# Patient Record
Sex: Female | Born: 1980 | Race: White | Hispanic: No | Marital: Single
Health system: Midwestern US, Community
[De-identification: ages and names within clinical notes are randomized; demographics above are authoritative.]

## PROBLEM LIST (undated history)

## (undated) DIAGNOSIS — O24419 Gestational diabetes mellitus in pregnancy, unspecified control: Secondary | ICD-10-CM

## (undated) DIAGNOSIS — F1120 Opioid dependence, uncomplicated: Secondary | ICD-10-CM

## (undated) DIAGNOSIS — F319 Bipolar disorder, unspecified: Secondary | ICD-10-CM

## (undated) DIAGNOSIS — F329 Major depressive disorder, single episode, unspecified: Secondary | ICD-10-CM

## (undated) DIAGNOSIS — F988 Other specified behavioral and emotional disorders with onset usually occurring in childhood and adolescence: Secondary | ICD-10-CM

## (undated) DIAGNOSIS — F32A Depression, unspecified: Secondary | ICD-10-CM

---

## 2001-08-09 ENCOUNTER — Emergency Department (HOSPITAL_COMMUNITY): Admission: EM | Admit: 2001-08-09 | Discharge: 2001-08-09 | Payer: Self-pay | Admitting: Emergency Medicine

## 2001-10-06 ENCOUNTER — Other Ambulatory Visit: Admission: RE | Admit: 2001-10-06 | Discharge: 2001-10-06 | Payer: Self-pay | Admitting: Obstetrics & Gynecology

## 2002-03-15 ENCOUNTER — Encounter: Admission: RE | Admit: 2002-03-15 | Discharge: 2002-03-15 | Payer: Self-pay | Admitting: Obstetrics and Gynecology

## 2002-05-25 ENCOUNTER — Inpatient Hospital Stay (HOSPITAL_COMMUNITY): Admission: AD | Admit: 2002-05-25 | Discharge: 2002-05-27 | Payer: Self-pay | Admitting: Obstetrics and Gynecology

## 2008-05-03 ENCOUNTER — Ambulatory Visit (HOSPITAL_COMMUNITY): Payer: Self-pay | Admitting: Psychiatry

## 2008-06-07 ENCOUNTER — Ambulatory Visit (HOSPITAL_COMMUNITY): Payer: Self-pay | Admitting: Psychiatry

## 2008-07-20 ENCOUNTER — Ambulatory Visit (HOSPITAL_COMMUNITY): Payer: Self-pay | Admitting: Psychiatry

## 2008-08-04 HISTORY — PX: TUBAL LIGATION: SHX77

## 2008-09-05 ENCOUNTER — Ambulatory Visit (HOSPITAL_COMMUNITY): Payer: Self-pay | Admitting: Psychiatry

## 2008-11-07 ENCOUNTER — Ambulatory Visit (HOSPITAL_COMMUNITY): Payer: Self-pay | Admitting: Psychiatry

## 2009-01-05 ENCOUNTER — Ambulatory Visit (HOSPITAL_COMMUNITY): Payer: Self-pay | Admitting: Psychiatry

## 2009-02-15 ENCOUNTER — Ambulatory Visit (HOSPITAL_COMMUNITY): Payer: Self-pay | Admitting: Psychiatry

## 2009-02-22 ENCOUNTER — Ambulatory Visit: Payer: Self-pay | Admitting: Family Medicine

## 2009-02-22 ENCOUNTER — Ambulatory Visit (HOSPITAL_COMMUNITY): Payer: Self-pay | Admitting: Psychiatry

## 2009-02-22 LAB — CONVERTED CEMR LAB: Clue Cells Wet Prep HPF POC: NONE SEEN

## 2009-02-23 ENCOUNTER — Encounter: Payer: Self-pay | Admitting: Family Medicine

## 2009-02-23 LAB — CONVERTED CEMR LAB
Chlamydia, DNA Probe: NEGATIVE
GC Probe Amp, Genital: NEGATIVE

## 2009-03-02 ENCOUNTER — Ambulatory Visit (HOSPITAL_COMMUNITY): Payer: Self-pay | Admitting: Psychiatry

## 2009-03-06 ENCOUNTER — Ambulatory Visit (HOSPITAL_COMMUNITY): Payer: Self-pay | Admitting: Psychiatry

## 2009-03-07 ENCOUNTER — Ambulatory Visit (HOSPITAL_COMMUNITY): Payer: Self-pay | Admitting: Psychiatry

## 2009-03-22 ENCOUNTER — Ambulatory Visit (HOSPITAL_COMMUNITY): Payer: Self-pay | Admitting: Psychiatry

## 2009-03-29 ENCOUNTER — Ambulatory Visit (HOSPITAL_COMMUNITY): Payer: Self-pay | Admitting: Psychiatry

## 2009-04-03 ENCOUNTER — Ambulatory Visit (HOSPITAL_COMMUNITY): Payer: Self-pay | Admitting: Psychiatry

## 2009-04-10 ENCOUNTER — Ambulatory Visit (HOSPITAL_COMMUNITY): Payer: Self-pay | Admitting: Psychiatry

## 2009-04-12 ENCOUNTER — Ambulatory Visit (HOSPITAL_COMMUNITY): Payer: Self-pay | Admitting: Psychiatry

## 2009-04-19 ENCOUNTER — Ambulatory Visit (HOSPITAL_COMMUNITY): Payer: Self-pay | Admitting: Psychiatry

## 2009-04-30 ENCOUNTER — Ambulatory Visit (HOSPITAL_COMMUNITY): Payer: Self-pay | Admitting: Psychiatry

## 2009-05-10 ENCOUNTER — Ambulatory Visit (HOSPITAL_COMMUNITY): Payer: Self-pay | Admitting: Psychiatry

## 2009-05-17 ENCOUNTER — Ambulatory Visit (HOSPITAL_COMMUNITY): Payer: Self-pay | Admitting: Psychiatry

## 2009-05-24 ENCOUNTER — Ambulatory Visit (HOSPITAL_COMMUNITY): Payer: Self-pay | Admitting: Psychiatry

## 2009-05-31 ENCOUNTER — Ambulatory Visit (HOSPITAL_COMMUNITY): Payer: Self-pay | Admitting: Psychiatry

## 2009-06-06 ENCOUNTER — Ambulatory Visit (HOSPITAL_COMMUNITY): Payer: Self-pay | Admitting: Psychiatry

## 2009-06-14 ENCOUNTER — Ambulatory Visit (HOSPITAL_COMMUNITY): Payer: Self-pay | Admitting: Psychiatry

## 2009-06-21 ENCOUNTER — Ambulatory Visit (HOSPITAL_COMMUNITY): Payer: Self-pay | Admitting: Psychiatry

## 2009-07-05 ENCOUNTER — Ambulatory Visit (HOSPITAL_COMMUNITY): Payer: Self-pay | Admitting: Psychiatry

## 2009-08-04 HISTORY — PX: CHOLECYSTECTOMY: SHX55

## 2009-08-09 ENCOUNTER — Ambulatory Visit (HOSPITAL_COMMUNITY): Payer: Self-pay | Admitting: Psychiatry

## 2009-08-16 ENCOUNTER — Ambulatory Visit (HOSPITAL_COMMUNITY): Payer: Self-pay | Admitting: Psychiatry

## 2009-08-30 ENCOUNTER — Ambulatory Visit (HOSPITAL_COMMUNITY): Payer: Self-pay | Admitting: Psychiatry

## 2009-09-02 ENCOUNTER — Ambulatory Visit: Payer: Self-pay | Admitting: Family Medicine

## 2009-09-04 ENCOUNTER — Ambulatory Visit (HOSPITAL_COMMUNITY): Payer: Self-pay | Admitting: Psychiatry

## 2009-09-06 ENCOUNTER — Ambulatory Visit (HOSPITAL_COMMUNITY): Payer: Self-pay | Admitting: Psychiatry

## 2009-10-11 ENCOUNTER — Ambulatory Visit (HOSPITAL_COMMUNITY): Payer: Self-pay | Admitting: Licensed Clinical Social Worker

## 2009-10-18 ENCOUNTER — Ambulatory Visit (HOSPITAL_COMMUNITY): Payer: Self-pay | Admitting: Licensed Clinical Social Worker

## 2009-10-25 ENCOUNTER — Ambulatory Visit (HOSPITAL_COMMUNITY): Payer: Self-pay | Admitting: Licensed Clinical Social Worker

## 2009-11-06 ENCOUNTER — Ambulatory Visit (HOSPITAL_COMMUNITY): Payer: Self-pay | Admitting: Psychiatry

## 2009-11-08 ENCOUNTER — Ambulatory Visit (HOSPITAL_COMMUNITY): Payer: Self-pay | Admitting: Licensed Clinical Social Worker

## 2009-12-13 ENCOUNTER — Ambulatory Visit (HOSPITAL_COMMUNITY): Payer: Self-pay | Admitting: Licensed Clinical Social Worker

## 2010-01-02 ENCOUNTER — Ambulatory Visit (HOSPITAL_COMMUNITY): Payer: Self-pay | Admitting: Psychiatry

## 2010-01-04 ENCOUNTER — Ambulatory Visit: Payer: Self-pay | Admitting: Family Medicine

## 2010-01-04 DIAGNOSIS — N760 Acute vaginitis: Secondary | ICD-10-CM | POA: Insufficient documentation

## 2010-01-04 DIAGNOSIS — N898 Other specified noninflammatory disorders of vagina: Secondary | ICD-10-CM | POA: Insufficient documentation

## 2010-01-05 ENCOUNTER — Encounter: Payer: Self-pay | Admitting: Family Medicine

## 2010-01-05 LAB — CONVERTED CEMR LAB
Chlamydia, DNA Probe: NEGATIVE
GC Probe Amp, Genital: NEGATIVE

## 2010-02-25 ENCOUNTER — Ambulatory Visit (HOSPITAL_COMMUNITY): Payer: Self-pay | Admitting: Psychiatry

## 2010-03-12 ENCOUNTER — Ambulatory Visit (HOSPITAL_COMMUNITY): Payer: Self-pay | Admitting: Licensed Clinical Social Worker

## 2010-03-20 ENCOUNTER — Ambulatory Visit (HOSPITAL_COMMUNITY): Payer: Self-pay | Admitting: Licensed Clinical Social Worker

## 2010-04-10 ENCOUNTER — Ambulatory Visit (HOSPITAL_COMMUNITY): Payer: Self-pay | Admitting: Licensed Clinical Social Worker

## 2010-04-25 ENCOUNTER — Ambulatory Visit (HOSPITAL_COMMUNITY): Payer: Self-pay | Admitting: Licensed Clinical Social Worker

## 2010-05-27 ENCOUNTER — Ambulatory Visit (HOSPITAL_COMMUNITY): Payer: Self-pay | Admitting: Psychiatry

## 2010-06-05 ENCOUNTER — Ambulatory Visit (HOSPITAL_COMMUNITY): Payer: Self-pay | Admitting: Licensed Clinical Social Worker

## 2010-06-10 ENCOUNTER — Ambulatory Visit (HOSPITAL_COMMUNITY): Payer: Self-pay | Admitting: Licensed Clinical Social Worker

## 2010-07-01 ENCOUNTER — Ambulatory Visit (HOSPITAL_COMMUNITY): Payer: Self-pay | Admitting: Psychiatry

## 2010-07-02 ENCOUNTER — Ambulatory Visit (HOSPITAL_COMMUNITY): Payer: Self-pay | Admitting: Licensed Clinical Social Worker

## 2010-09-03 NOTE — Assessment & Plan Note (Signed)
Summary: LBP- x 1 dy rm 2   Vital Signs:  Patient Profile:   30 Years Old Female CC:      LBP x 1 day Height:     67 inches Weight:      182 pounds O2 Sat:      100 % O2 treatment:    Room Air Temp:     97.3 degrees F oral Pulse rate:   83 / minute Pulse rhythm:   regular Resp:     16 per minute BP sitting:   120 / 75  (right arm) Cuff size:   regular  Pt. in pain?   yes    Location:   lower back    Intensity:   8    Type:       sharp  Vitals Entered By: Areta Haber CMA (September 02, 2009 2:03 PM)                   Current Allergies: No known allergies History of Present Illness Chief Complaint: LBP x 1 day History of Present Illness: Loading dishwasher adn htne sudden sharp pain in her left low back.  Hard to stand up. Took IBU last night and not really helpful. Laid on heating pad.  NO prior injuries to her back.  Never happened before. Turning and standing up more painful. Moving in general hurts worse.  No alleviating sxs.  No raditaion of pain into her legs or weekness. No numbness or tingling. Still a sharp pain about 8/10.  No urinary sxs or fever.   Current Problems: BACK PAIN (ICD-724.5)   Current Meds SEROQUEL 100 MG TABS (QUETIAPINE FUMARATE) 1 qd LEXAPRO 20 MG TABS (ESCITALOPRAM OXALATE) 1 tab by mouth once daily METRONIDAZOLE 500 MG  TABS (METRONIDAZOLE) 1 tablet by mouth 3x a day do not take w/ etoh ADVIL 200 MG TABS (IBUPROFEN) as directed TRAMADOL HCL 50 MG TABS (TRAMADOL HCL) Take 1 tablet by mouth three times a day  as needed for severe pain. DICLOFENAC SODIUM 75 MG TBEC (DICLOFENAC SODIUM) Take 1 tablet by mouth two times a day for inflammation of back FLEXERIL 10 MG TABS (CYCLOBENZAPRINE HCL) 1/2-1 tab by mouth up to three times a day as needed muscle spasm  REVIEW OF SYSTEMS Constitutional Symptoms      Denies fever, chills, night sweats, weight loss, weight gain, and fatigue.  Eyes       Denies change in vision, eye pain, eye discharge,  glasses, contact lenses, and eye surgery. Ear/Nose/Throat/Mouth       Denies hearing loss/aids, change in hearing, ear pain, ear discharge, dizziness, frequent runny nose, frequent nose bleeds, sinus problems, sore throat, hoarseness, and tooth pain or bleeding.  Respiratory       Denies dry cough, productive cough, wheezing, shortness of breath, asthma, bronchitis, and emphysema/COPD.  Cardiovascular       Denies murmurs, chest pain, and tires easily with exhertion.    Gastrointestinal       Denies stomach pain, nausea/vomiting, diarrhea, constipation, blood in bowel movements, and indigestion. Genitourniary       Denies painful urination, kidney stones, and loss of urinary control. Neurological       Denies paralysis, seizures, and fainting/blackouts. Musculoskeletal       Complains of decreased range of motion.      Denies muscle pain, joint pain, joint stiffness, redness, swelling, muscle weakness, and gout.      Comments: LBP x 1 day Skin  Denies bruising, unusual mles/lumps or sores, and hair/skin or nail changes.  Psych       Denies mood changes, temper/anger issues, anxiety/stress, speech problems, depression, and sleep problems. Other Comments: Pt states that she was loading/unloading  the diswaher when she felt a sharp pain in her lower back.   Past History:  Past Medical History: Last updated: 02/22/2009 Bi-polar Depression  Past Surgical History: Last updated: 02/22/2009 tubal ligation  Family History: Last updated: 02/22/2009 Mother, Healthy Father, Healthy Brother, Healthy Sister, Healthy  Social History: Last updated: 02/22/2009 1 ppd smoker, 15 yrs ETOH-yes No Drugs Unemployed Physical Exam General appearance: well developed, well nourished, no acute distress Head: normocephalic, atraumatic Back: Decreased flexion to 45 degree. Normal side bending and rotation right and left thought more painful to the right.  Normal extension. Nontender over the  lumbar spine . Midly tender over the left paraspinour muscles.  No SI joint tendernesss.   Neg straight leg raise. Hip, knee, and ankle strength 5/5.  Patellar and achilles 2+ bilat.   Assessment New Problems: BACK PAIN (ICD-724.5)   Plan New Medications/Changes: FLEXERIL 10 MG TABS (CYCLOBENZAPRINE HCL) 1/2-1 tab by mouth up to three times a day as needed muscle spasm  #20 x 0, 09/02/2009, Nani Gasser MD DICLOFENAC SODIUM 75 MG TBEC (DICLOFENAC SODIUM) Take 1 tablet by mouth two times a day for inflammation of back  #30 x 0, 09/02/2009, Nani Gasser MD TRAMADOL HCL 50 MG TABS (TRAMADOL HCL) Take 1 tablet by mouth three times a day  as needed for severe pain.  #20 x 0, 09/02/2009, Nani Gasser MD  New Orders: Est. Patient Level IV [phone] Planning Comments:   REviewed some home exercises to start tomorrow. USE NSAID two times a day with food. Stop if stomach bothers her. Can use the muscle relaxer as needed. Warned about sedation. Don't dirve with med. Can use tramadol on top of this as needed for acute relief. Fu in 1 week if not improving or sooner if sxs worsen.   The patient and/or caregiver has been counseled thoroughly with regard to medications prescribed including dosage, schedule, interactions, rationale for use, and possible side effects and they verbalize understanding.  Diagnoses and expected course of recovery discussed and will return if not improved as expected or if the condition worsens. Patient and/or caregiver verbalized understanding.  Prescriptions: FLEXERIL 10 MG TABS (CYCLOBENZAPRINE HCL) 1/2-1 tab by mouth up to three times a day as needed muscle spasm  #20 x 0   Entered and Authorized by:   Nani Gasser MD   Signed by:   Nani Gasser MD on 09/02/2009   Method used:   Electronically to        [facility] [phone]* (retail)       [address] [place], [place]  [address]       Ph: [phone]       Fax: [phone]   RxID:    [id number] DICLOFENAC SODIUM 75 MG TBEC (DICLOFENAC SODIUM) Take 1 tablet by mouth two times a day for inflammation of back  #30 x 0   Entered and Authorized by:   Nani Gasser MD   Signed by:   Nani Gasser MD on 09/02/2009   Method used:   Electronically to        [facility] [phone]* (retail)       [address] [place]  [address]       Ph: [phone]       Fax: [phone]   RxID:   [id number] TRAMADOL HCL 50 MG TABS (TRAMADOL HCL) Take 1 tablet by mouth three times a day  as needed for severe pain.  #20 x 0   Entered and Authorized by:   Nani Gasser MD   Signed by:   Nani Gasser MD on 09/02/2009   Method used:   Electronically to        [facility] [phone]* (retail)       [address] [place], [place]  [address]       Ph: [phone]       Fax: [phone]   RxID:   [id number]   Patient Instructions: 1)  Discussed gentle streches. Avoid lying in bed.

## 2010-09-03 NOTE — Assessment & Plan Note (Signed)
Summary: BACTERIAL INFECTION   Vital Signs:  Patient Profile:   30 Years Old Female CC:      vaginal discharge and foul odor X 2 weeks LMP:     12/15/2009 Height:     67 inches Weight:      180 pounds O2 Sat:      96 % O2 treatment:    Room Air Temp:     97.7 degrees F oral Pulse rate:   79 / minute Resp:     16 per minute BP sitting:   120 / 76  (right arm) Cuff size:   regular  Pt. in pain?   no  Vitals Entered By: Lajean Saver RN (January 04, 2010 2:51 PM)  Menstrual History: LMP (date): 12/15/2009                   Updated Prior Medication List: SEROQUEL 100 MG TABS (QUETIAPINE FUMARATE) 1 qd LEXAPRO 20 MG TABS (ESCITALOPRAM OXALATE) 1 tab by mouth once daily METRONIDAZOLE 500 MG  TABS (METRONIDAZOLE) 1 tablet by mouth 3x a day do not take w/ etoh TRAMADOL HCL 50 MG TABS (TRAMADOL HCL) Take 1 tablet by mouth three times a day  as needed for severe pain.  Current Allergies (reviewed today): No known allergies History of Present Illness Chief Complaint: vaginal discharge and foul odor X 2 weeks History of Present Illness: Subjective:  Patient complains of 2 week history of foul smelling vaginal discharge without pelvic pain or external vulvar symptoms.  Last menstrual period 3 weeks ago and normal.  She is s/p BTL.  No fevers, chills, and sweats.  No nausea/vomiting.  No urinary symptoms. She states that she has had bacterial vaginosis before and present symptoms are similar.   Last intercourse one week ago.  She states that she has not had a pap smear.  REVIEW OF SYSTEMS Constitutional Symptoms      Denies fever, chills, night sweats, weight loss, weight gain, and fatigue.  Eyes       Denies change in vision, eye pain, eye discharge, glasses, contact lenses, and eye surgery. Ear/Nose/Throat/Mouth       Denies hearing loss/aids, change in hearing, ear pain, ear discharge, dizziness, frequent runny nose, frequent nose bleeds, sinus problems, sore throat, hoarseness,  and tooth pain or bleeding.  Respiratory       Denies dry cough, productive cough, wheezing, shortness of breath, asthma, bronchitis, and emphysema/COPD.  Cardiovascular       Denies murmurs, chest pain, and tires easily with exhertion.    Gastrointestinal       Denies stomach pain, nausea/vomiting, diarrhea, constipation, blood in bowel movements, and indigestion. Genitourniary       Complains of blood or discharge from vagina.      Denies painful urination, kidney stones, and loss of urinary control.      Comments: odor Neurological       Denies paralysis, seizures, and fainting/blackouts. Musculoskeletal       Denies muscle pain, joint pain, joint stiffness, decreased range of motion, redness, swelling, muscle weakness, and gout.  Skin       Denies bruising, unusual mles/lumps or sores, and hair/skin or nail changes.  Psych       Denies mood changes, temper/anger issues, anxiety/stress, speech problems, depression, and sleep problems. Other Comments: X 2 weeks   Past History:  Past Medical History: Reviewed history from 02/22/2009 and no changes required. Bi-polar Depression  Past Surgical History: Reviewed history from 02/22/2009  and no changes required. tubal ligation  Family History: Reviewed history from 02/22/2009 and no changes required. Mother, Healthy Father, Healthy Brother, Healthy Sister, Healthy  Social History: 1 ppd smoker, 15 yrs ETOH-yes No Drugs   Objective:  Appearance:  Patient appears healthy, stated age, and in no acute distress  Neck:  Supple.  No adenopathy is present.  No thyromegaly is present  Lungs:  Clear to auscultation.  Breath sounds are equal.  Heart:  Regular rate and rhythm without murmurs, rubs, or gallops.  Abdomen:  Nontender without masses or hepatosplenomegaly.  Bowel sounds are present.  No CVA or flank tenderness.  Genitourinary:  Vulva appears normal without lesions or erythema.  Vagina has normal  mucosae without  lesions.   A small amount of clear mucoid discharge is present in the vaginal vault.  Cervix appears normal without lesions.  There is a small amount of blood tinged mucous present in the cervical os.  No cervical motion tenderness present.    Uterus is small and nontender.  Adnexae are nontender without massess.  KOH prep negative Wet prep:  clue cells present. Assessment New Problems: BACTERIAL VAGINITIS (ICD-616.10) VAGINAL DISCHARGE (ICD-623.5)  BACTERIAL VAGINOSIS  Plan New Medications/Changes: METROGEL-VAGINAL 0.75 % GEL (METRONIDAZOLE) 1 applicatorful PV once daily for 5 days  #1 tube x 0, 01/04/2010, Donna Christen MD  New Orders: T- GC Chlamydia [16109] Wet Prep [[id number]] Est. Patient Level IV [81191] Planning Comments:   Chlamydia/GC pending. Begin Metrogel vaginal  Follow-up with PCP if not improving or if symptoms worsen.   The patient and/or caregiver has been counseled thoroughly with regard to medications prescribed including dosage, schedule, interactions, rationale for use, and possible side effects and they verbalize understanding.  Diagnoses and expected course of recovery discussed and will return if not improved as expected or if the condition worsens. Patient and/or caregiver verbalized understanding.  Prescriptions: METROGEL-VAGINAL 0.75 % GEL (METRONIDAZOLE) 1 applicatorful PV once daily for 5 days  #1 tube x 0   Entered and Authorized by:   Donna Christen MD   Signed by:   Donna Christen MD on 01/04/2010   Method used:   Print then Give to Patient   RxID:   [id number]   Orders Added: 1)  T- GC Chlamydia [46962] 2)  Wet Prep [[id number]] 3)  Est. Patient Level IV [24401]

## 2011-01-02 ENCOUNTER — Encounter (HOSPITAL_COMMUNITY): Payer: Self-pay | Admitting: Licensed Clinical Social Worker

## 2011-01-09 ENCOUNTER — Encounter (INDEPENDENT_AMBULATORY_CARE_PROVIDER_SITE_OTHER): Payer: Medicaid Other | Admitting: Psychiatry

## 2011-01-09 DIAGNOSIS — F319 Bipolar disorder, unspecified: Secondary | ICD-10-CM

## 2011-01-21 ENCOUNTER — Encounter (HOSPITAL_COMMUNITY): Payer: Medicaid Other | Admitting: Licensed Clinical Social Worker

## 2011-02-20 ENCOUNTER — Encounter (INDEPENDENT_AMBULATORY_CARE_PROVIDER_SITE_OTHER): Payer: Medicaid Other | Admitting: Licensed Clinical Social Worker

## 2011-02-20 DIAGNOSIS — F3162 Bipolar disorder, current episode mixed, moderate: Secondary | ICD-10-CM

## 2011-03-13 ENCOUNTER — Encounter (HOSPITAL_COMMUNITY): Payer: Medicaid Other | Admitting: Psychiatry

## 2011-12-10 ENCOUNTER — Emergency Department (HOSPITAL_COMMUNITY): Payer: Self-pay

## 2011-12-10 ENCOUNTER — Encounter (HOSPITAL_COMMUNITY): Payer: Self-pay | Admitting: Emergency Medicine

## 2011-12-10 ENCOUNTER — Emergency Department (HOSPITAL_COMMUNITY)
Admission: EM | Admit: 2011-12-10 | Discharge: 2011-12-10 | Disposition: A | Discharge status: Home / Self Care | Payer: Self-pay | Attending: Emergency Medicine | Admitting: Emergency Medicine

## 2011-12-10 DIAGNOSIS — N12 Tubulo-interstitial nephritis, not specified as acute or chronic: Secondary | ICD-10-CM | POA: Insufficient documentation

## 2011-12-10 DIAGNOSIS — R109 Unspecified abdominal pain: Secondary | ICD-10-CM | POA: Insufficient documentation

## 2011-12-10 HISTORY — DX: Gestational diabetes mellitus in pregnancy, unspecified control: O24.419

## 2011-12-10 LAB — CBC
HCT: 45.5 % (ref 36.0–46.0)
MCHC: 34.7 g/dL (ref 30.0–36.0)
RDW: 12.6 % (ref 11.5–15.5)

## 2011-12-10 LAB — DIFFERENTIAL
Basophils Absolute: 0 10*3/uL (ref 0.0–0.1)
Basophils Relative: 1 % (ref 0–1)
Eosinophils Absolute: 0.2 10*3/uL (ref 0.0–0.7)
Eosinophils Relative: 3 % (ref 0–5)
Monocytes Absolute: 0.7 10*3/uL (ref 0.1–1.0)
Neutro Abs: 5 10*3/uL (ref 1.7–7.7)

## 2011-12-10 LAB — COMPREHENSIVE METABOLIC PANEL
AST: 34 U/L (ref 0–37)
Albumin: 4 g/dL (ref 3.5–5.2)
Calcium: 9.5 mg/dL (ref 8.4–10.5)
Chloride: 103 mEq/L (ref 96–112)
Creatinine, Ser: 0.72 mg/dL (ref 0.50–1.10)
Total Protein: 7.4 g/dL (ref 6.0–8.3)

## 2011-12-10 LAB — URINE MICROSCOPIC-ADD ON

## 2011-12-10 LAB — URINALYSIS, ROUTINE W REFLEX MICROSCOPIC
Bilirubin Urine: NEGATIVE
Ketones, ur: NEGATIVE mg/dL
Nitrite: POSITIVE — AB
Specific Gravity, Urine: 1.014 (ref 1.005–1.030)
Urobilinogen, UA: 1 mg/dL (ref 0.0–1.0)

## 2011-12-10 MED ORDER — SODIUM CHLORIDE 0.9 % IV BOLUS (SEPSIS)
1000.0000 mL | Freq: Once | INTRAVENOUS | Status: AC
  Administered 2011-12-10: 1000 mL via INTRAVENOUS

## 2011-12-10 MED ORDER — MORPHINE SULFATE 4 MG/ML IJ SOLN
8.0000 mg | Freq: Once | INTRAMUSCULAR | Status: AC
  Administered 2011-12-10: 8 mg via INTRAVENOUS
  Filled 2011-12-10: qty 2

## 2011-12-10 MED ORDER — CIPROFLOXACIN IN D5W 400 MG/200ML IV SOLN
400.0000 mg | Freq: Once | INTRAVENOUS | Status: AC
  Administered 2011-12-10: 400 mg via INTRAVENOUS
  Filled 2011-12-10: qty 200

## 2011-12-10 MED ORDER — CIPROFLOXACIN HCL 500 MG PO TABS: 500.0000 mg | ORAL_TABLET | Freq: Two times a day (BID) | ORAL | Status: AC

## 2011-12-10 MED ORDER — HYDROMORPHONE HCL PF 1 MG/ML IJ SOLN
1.0000 mg | Freq: Once | INTRAMUSCULAR | Status: AC
  Administered 2011-12-10: 1 mg via INTRAVENOUS
  Filled 2011-12-10: qty 1

## 2011-12-10 MED ORDER — OXYCODONE HCL 5 MG PO TABS: 5.0000 mg | ORAL_TABLET | ORAL | Status: AC | PRN

## 2011-12-10 MED ORDER — ONDANSETRON HCL 4 MG/2ML IJ SOLN
4.0000 mg | Freq: Once | INTRAMUSCULAR | Status: AC
  Administered 2011-12-10: 4 mg via INTRAVENOUS
  Filled 2011-12-10: qty 2

## 2011-12-10 MED ORDER — NICOTINE 14 MG/24HR TD PT24
14.0000 mg | MEDICATED_PATCH | Freq: Once | TRANSDERMAL | Status: DC
  Administered 2011-12-10: 14 mg via TRANSDERMAL
  Filled 2011-12-10: qty 1

## 2011-12-10 NOTE — ED Provider Notes (Signed)
Complains of right flank pain gradual and since yesterday no nausea no vomiting no fever no treatment prior to coming here. Admits to mild dysuria accompanying these symptoms No other associated symptoms. no abdominal pain no other associated symptoms on exam alert nontoxic Lungs clear auscultation abdomen nondistended nontender positive right flank tenderness Lab work exam and history consistent with pyelonephritis  Doug Sou, MD 12/10/11 [phone]

## 2011-12-10 NOTE — Discharge Instructions (Signed)
Pyelonephritis, Adult Pyelonephritis is a kidney infection. In general, there are 2 main types of pyelonephritis:  Infections that come on quickly without any warning (acute pyelonephritis).   Infections that persist for a long period of time (chronic pyelonephritis).  CAUSES  Two main causes of pyelonephritis are:  Bacteria traveling from the bladder to the kidney. This is a problem especially in pregnant women. The urine in the bladder can become filled with bacteria from multiple causes, including:   Inflammation of the prostate gland (prostatitis).   Sexual intercourse in females.   Bladder infection (cystitis).   Bacteria traveling from the bloodstream to the tissue part of the kidney.  Problems that may increase your risk of getting a kidney infection include:  Diabetes.   Kidney stones or bladder stones.   Cancer.   Catheters placed in the bladder.   Other abnormalities of the kidney or ureter.  SYMPTOMS   Abdominal pain.   Pain in the side or flank area.   Fever.   Chills.   Upset stomach.   Blood in the urine (dark urine).   Frequent urination.   Strong or persistent urge to urinate.   Burning or stinging when urinating.  DIAGNOSIS  Your caregiver may diagnose your kidney infection based on your symptoms. A urine sample may also be taken. TREATMENT  In general, treatment depends on how severe the infection is.   If the infection is mild and caught early, your caregiver may treat you with oral antibiotics and send you home.   If the infection is more severe, the bacteria may have gotten into the bloodstream. This will require intravenous (IV) antibiotics and a hospital stay. Symptoms may include:   High fever.   Severe flank pain.   Shaking chills.   Even after a hospital stay, your caregiver may require you to be on oral antibiotics for a period of time.   Other treatments may be required depending upon the cause of the infection.  HOME CARE  INSTRUCTIONS   Take your antibiotics as directed. Finish them even if you start to feel better.   Make an appointment to have your urine checked to make sure the infection is gone.   Drink enough fluids to keep your urine clear or pale yellow.   Take medicines for the bladder if you have urgency and frequency of urination as directed by your caregiver.  SEEK IMMEDIATE MEDICAL CARE IF:   You have a fever.   You are unable to take your antibiotics or fluids.   You develop shaking chills.   You experience extreme weakness or fainting.   There is no improvement after 2 days of treatment.  MAKE SURE YOU:  Understand these instructions.   Will watch your condition.   Will get help right away if you are not doing well or get worse.  Document Released: 07/21/2005 Document Revised: 07/10/2011 Document Reviewed: 12/25/2010 [employer] Patient Information 2012 [employer]. 

## 2011-12-10 NOTE — ED Provider Notes (Signed)
History     CSN: [id number]  Arrival date & time 12/10/11  1224   First MD Initiated Contact with Patient 12/10/11 1346      Chief Complaint  Patient presents with  . Flank Pain    (Consider location/radiation/quality/duration/timing/severity/associated sxs/prior treatment) HPI  Patient presents to the ED with complaints of right flank pain that started acutely yesterday morning. It is a constant pain, that is sharp and stabbing. She denies fevers, nausea, vomiting, lower abdominal pain, dysuria. She has no history of kidney stones, UTIs, or severe pain like this before. The pain radiates down her flank, is severe with no other associated symptoms.  Past Medical History  Diagnosis Date  . Gestational diabetes     Past Surgical History  Procedure Date  . Cholecystectomy 2011  . Tubal ligation 2010    History reviewed. No pertinent family history.  History  Substance Use Topics  . Smoking status: Current Everyday Smoker -- 1.0 packs/day  . Smokeless tobacco: Not on file  . Alcohol Use: No    OB History    Grav Para Term Preterm Abortions TAB SAB Ect Mult Living                  Review of Systems   HEENT: denies blurry vision or change in hearing PULMONARY: Denies difficulty breathing and SOB CARDIAC: denies chest pain or heart palpitations MUSCULOSKELETAL:  denies being unable to ambulate ABDOMEN AL: denies vomiting and diarrhea GU: denies loss of bowel or urinary control NEURO: denies numbness and tingling in extremities   Allergies  Tylenol  Home Medications   Current Outpatient Rx  Name Route Sig Dispense Refill  . CITALOPRAM HYDROBROMIDE 10 MG PO TABS Oral Take 10 mg by mouth daily.    Marland Kitchen LAMOTRIGINE 100 MG PO TABS Oral Take 100 mg by mouth daily.    . TRAZODONE HCL 100 MG PO TABS Oral Take 100 mg by mouth at bedtime.    Marland Kitchen CIPROFLOXACIN HCL 500 MG PO TABS Oral Take 1 tablet (500 mg total) by mouth 2 (two) times daily. 28 tablet 0  . OXYCODONE HCL 5  MG PO TABS Oral Take 1 tablet (5 mg total) by mouth every 4 (four) hours as needed for pain. 15 tablet 0    BP 98/65  Pulse 69  Temp(Src) 98.1 F (36.7 C) (Oral)  Resp 20  Ht 5\' 7"  (1.702 m)  Wt 140 lb (63.504 kg)  BMI 21.93 kg/m2  SpO2 98%  LMP 11/27/2011  Physical Exam  Nursing note and vitals reviewed. Constitutional: She appears well-developed and well-nourished. No distress.  HENT:  Head: Normocephalic and atraumatic.  Eyes: Pupils are equal, round, and reactive to light.  Neck: Normal range of motion. Neck supple.  Cardiovascular: Normal rate and regular rhythm.   Pulmonary/Chest: Effort normal.  Abdominal: Soft. She exhibits no distension and no mass. There is tenderness (right flank). There is no rebound and no guarding.  Neurological: She is alert.  Skin: Skin is warm and dry.    ED Course  Procedures (including critical care time)  Labs Reviewed  URINALYSIS, ROUTINE W REFLEX MICROSCOPIC - Abnormal; Notable for the following:    APPearance CLOUDY (*)    Hgb urine dipstick LARGE (*)    Protein, ur 100 (*)    Nitrite POSITIVE (*)    Leukocytes, UA SMALL (*)    All other components within normal limits  URINE MICROSCOPIC-ADD ON - Abnormal; Notable for the following:  Squamous Epithelial / LPF FEW (*)    Bacteria, UA MANY (*)    All other components within normal limits  CBC - Abnormal; Notable for the following:    Hemoglobin 15.8 (*)    All other components within normal limits  COMPREHENSIVE METABOLIC PANEL - Abnormal; Notable for the following:    Glucose, Bld 106 (*)    ALT 59 (*)    Total Bilirubin 1.3 (*)    All other components within normal limits  POCT PREGNANCY, URINE  DIFFERENTIAL  URINE CULTURE   Ct Abdomen Pelvis Wo Contrast  12/10/2011  *RADIOLOGY REPORT*  Clinical Data: Right flank pain for 2 days.  CT ABDOMEN AND PELVIS WITHOUT CONTRAST  Technique:  Multidetector CT imaging of the abdomen and pelvis was performed following the standard  protocol without intravenous contrast.  Comparison: None.  Findings: Dependent atelectasis present in the lungs.  The Unenhanced CT was performed per clinician order.  Lack of IV contrast limits sensitivity and specificity, especially for evaluation of abdominal/pelvic solid viscera.  The noncontrast appearance of the liver is within normal limits. Splenomegaly is present.  Splenic span is 13 cm.  Splenic volume is 640 ml.  The abnormal right renal axis.  No renal calculi.  The adrenal glands appear normal.  Suboptimal visualization of bowel due to noncontrast technique.  Normal appendix.  No free fluid. Physiologic appearance of the uterus and adnexa with probable left ovarian cyst.  Prominent stool burden.  There are no aggressive osseous lesions.  Urinary bladder appears normal.  IMPRESSION:  1.  Splenomegaly.  Splenic volume 640 ml.  This is nonspecific. This can be associated with infectious mononucleosis, sarcoidosis or lymphoproliferative disorders. 2.  Cholecystectomy. 3.  Negative for urolithiasis.  Original Report Authenticated By: Andreas Newport, M.D.     1. Pyelonephritis       MDM  Dr. Ethelda Chick has evaluated patient and believer her to have Pyelonephritis. IV Cipro 400mg  has been given as well as pain control. Pt will be given Rx for pain medication and Cipro then can follow-up with her PCP within the next week.  Pt has been advised of the symptoms that warrant their return to the ED. Patient has voiced understanding and has agreed to follow-up with the PCP or specialist.         Dorthula Matas, PA 12/10/11 [phone]

## 2011-12-10 NOTE — ED Notes (Signed)
Right flank pain started yesterday  Gradually worsening. Hurts constantly, sharp,  stabbing, no difficulty urinating

## 2011-12-12 LAB — URINE CULTURE: Culture  Setup Time: 201305090543

## 2011-12-12 NOTE — ED Provider Notes (Signed)
Medical screening examination/treatment/procedure(s) were conducted as a shared visit with non-physician practitioner(s) and myself.  I personally evaluated the patient during the encounter  Doug Sou, MD 12/12/11 [phone]

## 2011-12-13 NOTE — ED Notes (Signed)
+  Urine. Patient treated with Cipro. Sensitive to same. Per protocol MD. °

## 2012-01-12 ENCOUNTER — Encounter (HOSPITAL_COMMUNITY): Payer: Self-pay | Admitting: *Deleted

## 2012-01-12 ENCOUNTER — Ambulatory Visit (HOSPITAL_COMMUNITY)
Admission: RE | Admit: 2012-01-12 | Discharge: 2012-01-12 | Disposition: A | Discharge status: Home / Self Care | Payer: Self-pay | Attending: Psychiatry | Admitting: Psychiatry

## 2012-01-12 ENCOUNTER — Emergency Department (HOSPITAL_COMMUNITY)
Admission: EM | Admit: 2012-01-12 | Discharge: 2012-01-12 | Disposition: A | Discharge status: Home / Self Care | Payer: Self-pay | Attending: Emergency Medicine | Admitting: Emergency Medicine

## 2012-01-12 ENCOUNTER — Emergency Department (HOSPITAL_COMMUNITY)
Admission: EM | Admit: 2012-01-12 | Discharge: 2012-01-13 | Disposition: A | Discharge status: Rehabilitation Facility | Payer: Self-pay | Attending: Emergency Medicine | Admitting: Emergency Medicine

## 2012-01-12 DIAGNOSIS — F319 Bipolar disorder, unspecified: Secondary | ICD-10-CM | POA: Insufficient documentation

## 2012-01-12 DIAGNOSIS — K5900 Constipation, unspecified: Secondary | ICD-10-CM | POA: Insufficient documentation

## 2012-01-12 DIAGNOSIS — F17200 Nicotine dependence, unspecified, uncomplicated: Secondary | ICD-10-CM | POA: Insufficient documentation

## 2012-01-12 DIAGNOSIS — F1120 Opioid dependence, uncomplicated: Secondary | ICD-10-CM | POA: Insufficient documentation

## 2012-01-12 DIAGNOSIS — Z79899 Other long term (current) drug therapy: Secondary | ICD-10-CM | POA: Insufficient documentation

## 2012-01-12 DIAGNOSIS — F1910 Other psychoactive substance abuse, uncomplicated: Secondary | ICD-10-CM

## 2012-01-12 DIAGNOSIS — F988 Other specified behavioral and emotional disorders with onset usually occurring in childhood and adolescence: Secondary | ICD-10-CM | POA: Insufficient documentation

## 2012-01-12 DIAGNOSIS — F1110 Opioid abuse, uncomplicated: Secondary | ICD-10-CM

## 2012-01-12 HISTORY — DX: Opioid dependence, uncomplicated: F11.20

## 2012-01-12 HISTORY — DX: Depression, unspecified: F32.A

## 2012-01-12 HISTORY — DX: Bipolar disorder, unspecified: F31.9

## 2012-01-12 HISTORY — DX: Other specified behavioral and emotional disorders with onset usually occurring in childhood and adolescence: F98.8

## 2012-01-12 HISTORY — DX: Major depressive disorder, single episode, unspecified: F32.9

## 2012-01-12 LAB — DIFFERENTIAL
Basophils Relative: 0 % (ref 0–1)
Lymphs Abs: 1.9 10*3/uL (ref 0.7–4.0)
Monocytes Relative: 5 % (ref 3–12)
Neutro Abs: 3.6 10*3/uL (ref 1.7–7.7)
Neutrophils Relative %: 59 % (ref 43–77)

## 2012-01-12 LAB — RAPID URINE DRUG SCREEN, HOSP PERFORMED
Barbiturates: NOT DETECTED
Tetrahydrocannabinol: NOT DETECTED

## 2012-01-12 LAB — COMPREHENSIVE METABOLIC PANEL
ALT: 90 U/L — ABNORMAL HIGH (ref 0–35)
Albumin: 4 g/dL (ref 3.5–5.2)
Alkaline Phosphatase: 105 U/L (ref 39–117)
BUN: 13 mg/dL (ref 6–23)
Chloride: 105 mEq/L (ref 96–112)
Potassium: 3.8 mEq/L (ref 3.5–5.1)
Sodium: 141 mEq/L (ref 135–145)
Total Bilirubin: 0.5 mg/dL (ref 0.3–1.2)
Total Protein: 7.8 g/dL (ref 6.0–8.3)

## 2012-01-12 LAB — ETHANOL: Alcohol, Ethyl (B): <11 mg/dL (ref 0–11)

## 2012-01-12 LAB — CBC
Hemoglobin: 16 g/dL — ABNORMAL HIGH (ref 12.0–15.0)
MCHC: 34.2 g/dL (ref 30.0–36.0)
Platelets: 224 10*3/uL (ref 150–400)
RBC: 5.09 MIL/uL (ref 3.87–5.11)

## 2012-01-12 MED ORDER — GABAPENTIN 300 MG PO CAPS
300.0000 mg | ORAL_CAPSULE | Freq: Every day | ORAL | Status: DC
  Filled 2012-01-12 (×2): qty 1

## 2012-01-12 MED ORDER — LAMOTRIGINE 100 MG PO TABS
100.0000 mg | ORAL_TABLET | Freq: Every day | ORAL | Status: DC
  Filled 2012-01-12 (×2): qty 1

## 2012-01-12 MED ORDER — CITALOPRAM HYDROBROMIDE 20 MG PO TABS
20.0000 mg | ORAL_TABLET | Freq: Every day | ORAL | Status: DC
  Filled 2012-01-12 (×2): qty 1

## 2012-01-12 MED ORDER — NAPROXEN 500 MG PO TABS: 500.0000 mg | ORAL_TABLET | Freq: Two times a day (BID) | ORAL | Status: DC | PRN

## 2012-01-12 MED ORDER — DICYCLOMINE HCL 20 MG PO TABS: 20.0000 mg | ORAL_TABLET | Freq: Four times a day (QID) | ORAL | Status: DC | PRN

## 2012-01-12 MED ORDER — METHOCARBAMOL 500 MG PO TABS
500.0000 mg | ORAL_TABLET | Freq: Three times a day (TID) | ORAL | Status: DC | PRN
  Filled 2012-01-12: qty 1

## 2012-01-12 MED ORDER — HYDROXYZINE HCL 25 MG PO TABS: 25.0000 mg | ORAL_TABLET | Freq: Four times a day (QID) | ORAL | Status: DC | PRN

## 2012-01-12 MED ORDER — ONDANSETRON 8 MG PO TBDP: 8.0000 mg | ORAL_TABLET | Freq: Three times a day (TID) | ORAL | Status: DC | PRN

## 2012-01-12 MED ORDER — ONDANSETRON 4 MG PO TBDP: 4.0000 mg | ORAL_TABLET | Freq: Four times a day (QID) | ORAL | Status: DC | PRN

## 2012-01-12 MED ORDER — LOPERAMIDE HCL 2 MG PO CAPS: 2.0000 mg | ORAL_CAPSULE | Freq: Four times a day (QID) | ORAL | Status: DC | PRN

## 2012-01-12 MED ORDER — LOPERAMIDE HCL 2 MG PO CAPS: 2.0000 mg | ORAL_CAPSULE | ORAL | Status: DC | PRN

## 2012-01-12 MED ORDER — TRAZODONE HCL 100 MG PO TABS: 200.0000 mg | ORAL_TABLET | Freq: Every day | ORAL | Status: DC

## 2012-01-12 NOTE — ED Notes (Addendum)
Pt states "I'm homeless, haven't taken my bipolar meds x 2 wks, if you give me money, I'll buy heroin before I buy my meds, I go to [place], my mother lives in [place] but I'm homeless, been living in my car for the past 3 months, my boyfriend lives in my car with me because he chooses to, he doesn't want me alone, he could live with his grandparents."

## 2012-01-12 NOTE — BH Assessment (Signed)
Assessment Note   Marissa Beasley is a 31 y.o. single white female.  She presents with her boyfriend, Bradly Bienenstock [phone]), who remained for assessment with pt's verbal consent.  She is requesting heroin detox, with preferred placement at [facility] with suboxone support.  She reports that heroin problems have persisted for about 1 year.  In 07/2011 she went through detox at [facility], and from there was sent to [facility] from 08/2011 - 09/2011 for residential rehab.  She did not follow up with 12-Step meetings.  In March she relapsed on heroin, and has been using up to 1/2 gram IM daily with most recent use last night.  She denies any current withdrawal, but during assessment, yawned once, and noted that she felt chilly.  She denies abusing any other substances.  She reports that her longest period of sobriety was 5 years, from 2007 - 2011, patterning around a pregnancy.  She denies current or past SI, HI, violence, or AH/VH.  She exhibits no delusional thought.  She endorses depression with symptoms noted in the "risk to self" assessment below.  She notes that she has been off psychotropic medications for about 2 weeks, except for Neurontin, for which she recently received a prescription that she has not yet filled.  While she cannot identify any precipitating reason for either her relapse, or for seeking treatment today, she does report that she has been homeless, living in her car for the past 2 - 3 months, ever since her mother evicted her due to relapse.  She also has two upcoming court dates, the first on 01/14/2012 for possession of drug paraphernalia, and the second on 02/02/2012 for hit and run.  She reports that she has two children, ages 60 y/o and 54 y/o, both of whom live with pt's mother by informal arrangement.  Pt sees them every week, and DSS has not been involved with them.    Axis I: Opioid Dependence 304.00; Bipolar Disorder NOS 296.80 Axis II: Deferred Axis III:  Past Medical History  Diagnosis  Date  . Gestational diabetes   . Bipolar disorder   . ADD (attention deficit disorder)   . Depression   . Heroin addiction    Axis IV: economic problems, housing problems, occupational problems, problems related to legal system/crime, problems with primary support group and parent-child relational problems Axis V: 41-50 serious symptoms  Past Medical History:  Past Medical History  Diagnosis Date  . Gestational diabetes   . Bipolar disorder   . ADD (attention deficit disorder)   . Depression   . Heroin addiction     Past Surgical History  Procedure Date  . Cholecystectomy 2011  . Tubal ligation 2010    Family History: No family history on file.  Social History:  reports that she has been smoking Cigarettes.  She has been smoking about 2 packs per day. She has never used smokeless tobacco. She reports that she uses illicit drugs (Heroin) about 7 times per week. She reports that she does not drink alcohol.  Additional Social History:  Alcohol / Drug Use Pain Medications: Denies Prescriptions: Denies Over the Counter: Denies Longest period of sobriety (when/how long): 5 years from 2007 - 2011 Negative Consequences of Use: Financial;Legal;Personal relationships;Work / Programmer, multimedia (Stopped seeking work & evicted by mother due to addiction;) Substance #1 Name of Substance 1: Heroin 1 - Age of First Use: 32 y/o 1 - Amount (size/oz): Up to 1/2 gram per day 1 - Frequency: Daily 1 - Duration: 1 -  2 months 1 - Last Use / Amount: 1/2 gram on 01/11/12; last use was last night  CIWA:   COWS: Clinical Opiate Withdrawal Scale (COWS) Sweating: Subjective report of chills or flushing Yawning: Yawning once or twice during assessment  Allergies:  Allergies  Allergen Reactions  . Tylenol (Acetaminophen) Rash    Home Medications:  (Not in a hospital admission)  OB/GYN Status:  Patient's last menstrual period was 01/07/2012.  General Assessment Data Location of Assessment: [facility]  Assessment Services Living Arrangements: Other (Comment) (Living in car x 3 months since evicted by mother.) Can pt return to current living arrangement?: Yes Admission Status: Voluntary Is patient capable of signing voluntary admission?: Yes Transfer from: Home Referral Source: Self/Family/Friend  Education Status Is patient currently in school?: No  Risk to self Suicidal Ideation: No Suicidal Intent: No Is patient at risk for suicide?: No Suicidal Plan?: No Access to Means: No What has been your use of drugs/alcohol within the last 12 months?: Daily use of heroin. Previous Attempts/Gestures: No How many times?: 0  Other Self Harm Risks: None reported Triggers for Past Attempts: Other (Comment) (Not applicable) Intentional Self Injurious Behavior: None Family Suicide History: No (Mother - depression) Recent stressful life event(s): Legal Issues (Homeless x 2 - 3 months; upcoming court dates.) Persecutory voices/beliefs?: No Depression: Yes Depression Symptoms: Insomnia;Tearfulness;Isolating;Fatigue;Guilt;Loss of interest in usual pleasures;Feeling worthless/self pity;Feeling angry/irritable (Hopelessness) Substance abuse history and/or treatment for substance abuse?: No (Abuses heroin; recently in detox/rehab) Suicide prevention information given to non-admitted patients: Yes  Risk to Others Homicidal Ideation: No Thoughts of Harm to Others: No Current Homicidal Intent: No Current Homicidal Plan: No Access to Homicidal Means: No Identified Victim: None History of harm to others?: No Assessment of Violence: None Noted Violent Behavior Description: Calm/cooperative Does patient have access to weapons?: No (Denies having access to guns.) Criminal Charges Pending?: Yes Describe Pending Criminal Charges: 1) Possession of drug paraphernalia; 2) Hit and run Does patient have a court date: Yes Court Date:  (1) 01/14/2012;      2) 02/02/2012)  Psychosis Hallucinations: None  noted Delusions: None noted  Mental Status Report Appear/Hygiene: Other (Comment) (Casual) Eye Contact: Poor (Absent) Motor Activity: Unremarkable Speech: Other (Comment) (Unremarkable) Level of Consciousness: Alert Mood: Depressed;Apathetic Affect: Unable to Assess (Constricted) Anxiety Level: None Thought Processes: Coherent;Relevant Judgement: Unimpaired Orientation: Person;Place;Time;Situation Obsessive Compulsive Thoughts/Behaviors: Minimal (Could not provide details)  Cognitive Functioning Concentration: Decreased ("I always have that.") Memory: Recent Intact;Remote Intact IQ: Average Insight: Fair Impulse Control: Fair Appetite: Good Weight Loss: 0  Weight Gain: 0  Sleep: Decreased Total Hours of Sleep: 6  (Initial insomnia; off Trazodone x 2 weeks.) Vegetative Symptoms: None  ADLScreening [facility] Assessment Services) Patient's cognitive ability adequate to safely complete daily activities?: Yes Patient able to express need for assistance with ADLs?: Yes Independently performs ADLs?: Yes  Abuse/Neglect [facility]) Physical Abuse: Denies Verbal Abuse: Denies Sexual Abuse: Denies  Prior Inpatient Therapy Prior Inpatient Therapy: Yes Prior Therapy Dates: 07/2011: [facility] for heroin detox Prior Therapy Facilty/Provider(s): 08/2011 - 09/2011: [facility] for residential rehab Reason for Treatment: Heroin addiction  Prior Outpatient Therapy Prior Outpatient Therapy: Yes Prior Therapy Dates: 08/2011 - present: [facility] for Bipolar Disorder  ADL Screening (condition at time of admission) Patient's cognitive ability adequate to safely complete daily activities?: Yes Patient able to express need for assistance with ADLs?: Yes Independently performs ADLs?: Yes Weakness of Legs: None Weakness of Arms/Hands: None  Home Assistive Devices/Equipment Home Assistive Devices/Equipment: Eyeglasses    Abuse/Neglect  Assessment (Assessment to be complete while patient is alone) Physical  Abuse: Denies Verbal Abuse: Denies Sexual Abuse: Denies Exploitation of patient/patient's resources: Denies Self-Neglect: Denies     Merchant navy officer (For Healthcare) Advance Directive: Patient does not have advance directive;Patient would not like information Pre-existing out of facility DNR order (yellow form or pink MOST form): No Nutrition Screen Diet: Regular Unintentional weight loss greater than 10lbs within the last month: No Problems chewing or swallowing foods and/or liquids: No Home Tube Feeding or Total Parenteral Nutrition (TPN): No Patient appears severely malnourished: No Pregnant or Lactating: No  Additional Information 1:1 In Past 12 Months?: No CIRT Risk: No Elopement Risk: No Does patient have medical clearance?: No     Disposition:  Disposition Disposition of Patient: Referred to (Sent to [facility] for medical clearance and placement) Patient referred to: Other (Comment) (Sent to [facility] for medical clearance and placement) Discussed pt with Aggie.  She believes pt would benefit from heroin detox, and that placement at [facility] should be sought.  She agrees that pt needs to be medically cleared at [facility] first.  At 11:50 I called report to Sedalia Muta, RN, charge nurse at [facility].  I then discussed plan with pt and her boyfriend, offering her transportation by [employer].  They opted instead for boyfriend to transport her, departing at 11:55.  I then called [place], assessment counselor, at 12:01 to notify her that pt is coming.   On Site Evaluation by:   Reviewed with Physician:  Serena Colonel, NP @ 11:45   Raphael Gibney 01/12/2012 12:35 PM

## 2012-01-12 NOTE — Discharge Instructions (Signed)
Chemical Dependency Chemical dependency is an addiction to drugs or alcohol. It is characterized by the repeated behavior of seeking out and using drugs and alcohol despite harmful consequences to the health and safety of ones self and others.  RISK FACTORS There are certain situations or behaviors that increase a person's risk for chemical dependency. These include:  A family history of chemical dependency.   A history of mental health issues, including depression and anxiety.   A home environment where drugs and alcohol are easily available to you.   Drug or alcohol use at a young age.  SYMPTOMS  The following symptoms can indicate chemical dependency:  Inability to limit the use of drugs or alcohol.   Nausea, sweating, shakiness, and anxiety that occurs when alcohol or drugs are not being used.   An increase in amount of drugs or alcohol that is necessary to get drunk or high.  People who experience these symptoms can assess their use of drugs and alcohol by asking themselves the following questions:  Have you been told by friends or family that they are worried about your use of alcohol or drugs?   Do friends and family ever tell you about things you did while drinking alcohol or using drugs that you do not remember?   Do you lie about using alcohol or drugs or about the amounts you use?   Do you have difficulty completing daily tasks unless you use alcohol or drugs?   Is the level of your work or school performance lower because of your drug or alcohol use?   Do you get sick from using drugs or alcohol but keep using anyway?   Do you feel uncomfortable in social situations unless you use alcohol or drugs?   Do you use drugs or alcohol to help forget problems?  An answer of yes to any of these questions may indicate chemical dependency. Professional evaluation is suggested. Document Released: 07/15/2001 Document Revised: 07/10/2011 Document Reviewed: 09/26/2010 ExitCare  Patient Information 2012 [employer].     Behavioral Health Resources in the Community  Intensive Outpatient Programs: [facility]      [address] [place], [place] [phone] Both a day and evening program       [facility]     [address]        [place], [place] [address] [phone]         [facility] [address] [place] [phone]  [facility] ACCESS LINE: 1-800-853-5163 or [phone] [address] [place], [place] [address] [email or web address].[email or web address]  Mobile Crisis Teams:                                        [facility]         Mobile Crisis Care Unit [phone]             [facility] [address] [place] [phone]                                         Interventionist Sharon DeEsch [address] [place]  [phone]  Self-Help/Support Groups: [facility] [place] Variety of support groups [phone] (call for more info)  Narcotics   Anonymous (NA) [facility] [address] [place] - 2 meetings at this location  Residential Treatment Programs:  [facility]      [address]        [facility]       [phone]         [facility] [address] [place], [place]  [address] [phone]  [facility]  [address] [place], [place] [address] [phone] Admissions: 8am-3pm M-F  [facility]     [address]        [place], [place] [address]       [phone]         [facility] [address] [place], [place] [phone]  [facility] [address] [place], [place] [phone]  [facility] - Intensive Outpatient      [address]     [place],  [place]       [phone]         [facility]     [address] [place], [place] [phone] or [phone]  [facility] ([facility]  [address] [place], [place] [phone]  [facility]                                               [address] [place] [phone]  [place] [facility] Resources: [facility]- [phone]               General Therapy                                                Julie Brannon, PhD        [address]                                       [place], [place] [address]         [phone]   Insurance  [facility]   [address] [place], [place] [address] [phone]  [facility] [address] [place], [place] [address] [phone] Insurance/Medicaid/sponsorship through [facility]  [facility]                                              [address]                                        [place], [place] [address]    Therapy/tele-psych/case         [phone]          [facility] [address]   [place],   [address]  Adolescent/group home/case management [phone]                                             Julia Brannon PhD       General therapy       Insurance   [phone]         Dr. Arfeen Insurance [phone] M-F  [facility], sponsorship [phone]  

## 2012-01-12 NOTE — ED Notes (Signed)
Pt. Was up for D/C prior to being brought to psych ed.  Notifed Consulting civil engineer, pt. Is up for D/C.  Pt. Given option to go to another rehab facility that does not use Suboxone with detox.  Pt. Does not want to go anywhere where she is not able to use suboxone while detoxing.  Pt. Given D/C papers from ACT team and EDP.

## 2012-01-12 NOTE — ED Notes (Signed)
Patient has one bag of belongings in triage locker 4.

## 2012-01-12 NOTE — BHH Counselor (Signed)
Patientt presented to [facility] and was assessed by staff Elijah Birk). Patient sent to the ED by staff at [facility] for medical clearance and placement. Contacted to [facility] to to inquire about their bed availability. Spoke to their staff Shary Key who says that their facility is at capacity and unable to consider any additional patients for today. Writer then contacted [facility]. Verified bed availability with their staff Larita Fife. Writer explained to nurse [facility]) caring for patient that pt's alternative option for treatment would be [facility]. Patient offered treatment at [facility], however; refused. Patient interested in only suboxene treatment.   Writer informed EPD (Dr. Roselyn Bering) of the above information. Patient is currently homeless but may seek treatment on a out-pt basis as an out of pocket expense. Patient will also be made aware of other resources for treatment prior to discharge such as residential programs, support groups, mobile crises, etc.

## 2012-01-12 NOTE — ED Notes (Signed)
Pt states was discharged around 1630 today; states needs detox from heroin; requesting to go to [facility]; states went home spent time with children and states she needed to come back and go wherever she can; denies any suicidal or homicidal ideations

## 2012-01-12 NOTE — Progress Notes (Signed)
ED CM consulted by Triage RN about chs medication indigent assistance. CM reviewed EPIC which indicates pt has medicaid therefore would not be eligible for [facility] indigent medication assistance.  Pt states her medicaid expired 07/2011.

## 2012-01-12 NOTE — ED Provider Notes (Signed)
History     CSN: [id number] Arrival date & time 01/12/12  1207 First MD Initiated Contact with Patient 01/12/12 1311      Chief Complaint  Patient presents with  . V70.1    HPI Patient presents to the emergency room with complaints of heroin addiction. Patient states she's been using heroin for undefined period of time. She is here in the emergency room requesting detox. She states she went to [facility] and was told to come to the emergency department.  She denies any other complaints of nausea, vomiting, fever, diarrhea or abdominal pain. She denies any suicidal or homicidal ideation. She does have a history of bipolar disorder as well as depression.  Past Medical History  Diagnosis Date  . Gestational diabetes   . Bipolar disorder   . ADD (attention deficit disorder)   . Depression   . Heroin addiction     Past Surgical History  Procedure Date  . Cholecystectomy 2011  . Tubal ligation 2010    No family history on file.  History  Substance Use Topics  . Smoking status: Current Everyday Smoker -- 2.0 packs/day    Types: Cigarettes  . Smokeless tobacco: Never Used  . Alcohol Use: No    OB History    Grav Para Term Preterm Abortions TAB SAB Ect Mult Living                  Review of Systems  All other systems reviewed and are negative.    Allergies  Tylenol  Home Medications   Current Outpatient Rx  Name Route Sig Dispense Refill  . CITALOPRAM HYDROBROMIDE 10 MG PO TABS Oral Take 20 mg by mouth daily. Has not taken in 2 weeks as of 01/12/2012.    Marland Kitchen GABAPENTIN 300 MG PO CAPS Oral Take 300 mg by mouth daily. This is an unfilled prescription.    Marland Kitchen LAMOTRIGINE 100 MG PO TABS Oral Take 100 mg by mouth daily. Has not taken in 2 weeks as of 01/12/2012.    Marland Kitchen TRAZODONE HCL 100 MG PO TABS Oral Take 200 mg by mouth at bedtime. Has not taken in 2 weeks as of 01/12/2012.      BP 102/52  Pulse 71  Temp(Src) 97.9 F (36.6 C) (Oral)  Resp 16  Wt 134 lb  (60.782 kg)  SpO2 98%  LMP 01/07/2012  Physical Exam  Nursing note and vitals reviewed. Constitutional: She appears well-developed and well-nourished. No distress.  HENT:  Head: Normocephalic and atraumatic.  Right Ear: External ear normal.  Left Ear: External ear normal.  Eyes: Conjunctivae are normal. Right eye exhibits no discharge. Left eye exhibits no discharge. No scleral icterus.  Neck: Neck supple. No tracheal deviation present.  Cardiovascular: Normal rate, regular rhythm and intact distal pulses.   Pulmonary/Chest: Effort normal and breath sounds normal. No stridor. No respiratory distress. She has no wheezes. She has no rales.  Abdominal: Soft. Bowel sounds are normal. She exhibits no distension. There is no tenderness. There is no rebound and no guarding.  Musculoskeletal: She exhibits no edema and no tenderness.  Neurological: She is alert. She has normal strength. No sensory deficit. Cranial nerve deficit:  no gross defecits noted. She exhibits normal muscle tone. She displays no seizure activity. Coordination normal.  Skin: Skin is warm and dry. No rash noted.  Psychiatric: She has a normal mood and affect.    ED Course  Procedures (including critical care time)  Labs Reviewed  URINE RAPID DRUG SCREEN (HOSP PERFORMED) - Abnormal; Notable for the following:    Opiates POSITIVE (*)    Cocaine POSITIVE (*)    All other components within normal limits  CBC - Abnormal; Notable for the following:    Hemoglobin 16.0 (*)    HCT 46.8 (*)    All other components within normal limits  COMPREHENSIVE METABOLIC PANEL - Abnormal; Notable for the following:    AST 48 (*)    ALT 90 (*)    All other components within normal limits  ETHANOL  DIFFERENTIAL   No results found.    MDM  Pt was assessed by [facility].  [facility] does not have any bed availability and this is where the patient wanted to go because they have suboxone.  Pt was offered other treatments but she is not interested.   Will dc home with resource guide.  At this time there does not appear to be any evidence of an acute emergency medical condition and the patient appears stable for discharge with appropriate outpatient follow up.         Celene Kras, MD 01/12/12 [phone]

## 2012-01-12 NOTE — ED Notes (Signed)
Per Jessie Foot [facility]'s request, this nurse notified pt that [facility] does not have any available bed at present, states that pt had requested to go there d/t the facility being able to give suboxone for detox.  [facility] in [place] is the other option.  Pt is refusing to go to [facility] because they do not give suboxone for detox.  [facility] made aware.

## 2012-01-12 NOTE — ED Notes (Signed)
Dr. Jeraldine Loots informed pt needs to be seen.

## 2012-01-13 ENCOUNTER — Telehealth (HOSPITAL_COMMUNITY): Payer: Self-pay | Admitting: Licensed Clinical Social Worker

## 2012-01-13 ENCOUNTER — Encounter (HOSPITAL_COMMUNITY): Payer: Self-pay | Admitting: Psychiatry

## 2012-01-13 LAB — COMPREHENSIVE METABOLIC PANEL
AST: 36 U/L (ref 0–37)
Albumin: 3.7 g/dL (ref 3.5–5.2)
Alkaline Phosphatase: 88 U/L (ref 39–117)
BUN: 14 mg/dL (ref 6–23)
Potassium: 3.7 mEq/L (ref 3.5–5.1)
Sodium: 140 mEq/L (ref 135–145)
Total Protein: 7 g/dL (ref 6.0–8.3)

## 2012-01-13 LAB — RAPID URINE DRUG SCREEN, HOSP PERFORMED
Amphetamines: NOT DETECTED
Benzodiazepines: NOT DETECTED

## 2012-01-13 LAB — CBC
HCT: 45 % (ref 36.0–46.0)
MCHC: 34.2 g/dL (ref 30.0–36.0)
Platelets: 220 10*3/uL (ref 150–400)
RDW: 12.7 % (ref 11.5–15.5)

## 2012-01-13 LAB — ETHANOL: Alcohol, Ethyl (B): <11 mg/dL (ref 0–11)

## 2012-01-13 LAB — PREGNANCY, URINE: Preg Test, Ur: NEGATIVE

## 2012-01-13 MED ORDER — IBUPROFEN 200 MG PO TABS: 600.0000 mg | ORAL_TABLET | Freq: Three times a day (TID) | ORAL | Status: DC | PRN

## 2012-01-13 MED ORDER — ZOLPIDEM TARTRATE 5 MG PO TABS: 5.0000 mg | ORAL_TABLET | Freq: Every evening | ORAL | Status: DC | PRN

## 2012-01-13 MED ORDER — LORAZEPAM 1 MG PO TABS: 1.0000 mg | ORAL_TABLET | Freq: Three times a day (TID) | ORAL | Status: DC | PRN

## 2012-01-13 MED ORDER — CLONIDINE HCL 0.1 MG PO TABS: 0.1000 mg | ORAL_TABLET | Freq: Every day | ORAL | Status: DC

## 2012-01-13 MED ORDER — CLONIDINE HCL 0.1 MG PO TABS
0.1000 mg | ORAL_TABLET | Freq: Four times a day (QID) | ORAL | Status: DC
  Administered 2012-01-13: 0.1 mg via ORAL
  Filled 2012-01-13: qty 1

## 2012-01-13 MED ORDER — METHOCARBAMOL 500 MG PO TABS
500.0000 mg | ORAL_TABLET | Freq: Three times a day (TID) | ORAL | Status: DC | PRN
  Filled 2012-01-13: qty 1

## 2012-01-13 MED ORDER — NICOTINE 21 MG/24HR TD PT24
21.0000 mg | MEDICATED_PATCH | Freq: Every day | TRANSDERMAL | Status: DC
  Administered 2012-01-13: 21 mg via TRANSDERMAL
  Filled 2012-01-13: qty 1

## 2012-01-13 MED ORDER — HYDROXYZINE HCL 25 MG PO TABS: 25.0000 mg | ORAL_TABLET | Freq: Four times a day (QID) | ORAL | Status: DC | PRN

## 2012-01-13 MED ORDER — DICYCLOMINE HCL 20 MG PO TABS: 20.0000 mg | ORAL_TABLET | Freq: Four times a day (QID) | ORAL | Status: DC | PRN

## 2012-01-13 MED ORDER — NAPROXEN 500 MG PO TABS: 500.0000 mg | ORAL_TABLET | Freq: Two times a day (BID) | ORAL | Status: DC | PRN

## 2012-01-13 MED ORDER — CLONIDINE HCL 0.1 MG PO TABS: 0.1000 mg | ORAL_TABLET | ORAL | Status: DC

## 2012-01-13 MED ORDER — LOPERAMIDE HCL 2 MG PO CAPS: 2.0000 mg | ORAL_CAPSULE | ORAL | Status: DC | PRN

## 2012-01-13 MED ORDER — ALUM & MAG HYDROXIDE-SIMETH 200-200-20 MG/5ML PO SUSP: 30.0000 mL | ORAL | Status: DC | PRN

## 2012-01-13 MED ORDER — ONDANSETRON 4 MG PO TBDP: 4.0000 mg | ORAL_TABLET | Freq: Four times a day (QID) | ORAL | Status: DC | PRN

## 2012-01-13 NOTE — ED Provider Notes (Signed)
History     CSN: [id number]  Arrival date & time 01/12/12  2257   First MD Initiated Contact with Patient 01/13/12 0012      Chief Complaint  Patient presents with  . Medical Clearance   HPI  History provided by the patient. Patient is a 31 year old female with history of bipolar disorder, and he and heroin addiction return to emergency room for request help for detox. Patient was seen earlier today resting detox at [facility]. That time there were no spots available patient did not wish to stay although given the option. Patient does report returning home and states that she did return to using heroin around 8 PM. Patient states that she felt like this was "jumped" because it did not help with any of her withdrawal symptoms. Patient returns stating that she adamantly wants detox and will go anywhere she can't. Patient denies any other complaints at this time. She denies any fever, sweats, nausea, vomiting or diarrhea. Patient denies any depression, HI or SI.    Past Medical History  Diagnosis Date  . Gestational diabetes   . Bipolar disorder   . ADD (attention deficit disorder)   . Depression   . Heroin addiction     Past Surgical History  Procedure Date  . Cholecystectomy 2011  . Tubal ligation 2010    No family history on file.  History  Substance Use Topics  . Smoking status: Current Everyday Smoker -- 2.0 packs/day    Types: Cigarettes  . Smokeless tobacco: Never Used  . Alcohol Use: No    OB History    Grav Para Term Preterm Abortions TAB SAB Ect Mult Living                  Review of Systems  Constitutional: Negative for fever, chills and diaphoresis.  Cardiovascular: Negative for chest pain and palpitations.  Gastrointestinal: Positive for constipation. Negative for nausea, vomiting, abdominal pain and diarrhea.    Allergies  Tylenol  Home Medications   Current Outpatient Rx  Name Route Sig Dispense Refill  . CITALOPRAM HYDROBROMIDE 10 MG PO TABS Oral  Take 20 mg by mouth daily. Has not taken in 2 weeks as of 01/12/2012.    Marland Kitchen GABAPENTIN 300 MG PO CAPS Oral Take 300 mg by mouth daily. This is an unfilled prescription.    Marland Kitchen LAMOTRIGINE 100 MG PO TABS Oral Take 100 mg by mouth daily. Has not taken in 2 weeks as of 01/12/2012.    Marland Kitchen TRAZODONE HCL 100 MG PO TABS Oral Take 200 mg by mouth at bedtime. Has not taken in 2 weeks as of 01/12/2012.      BP 106/45  Pulse 65  Temp(Src) 97.8 F (36.6 C) (Oral)  Resp 18  SpO2 100%  LMP 01/07/2012  Physical Exam  Nursing note and vitals reviewed. Constitutional: She is oriented to person, place, and time. She appears well-developed and well-nourished. No distress.  HENT:  Head: Normocephalic and atraumatic.  Eyes: Conjunctivae and EOM are normal. Pupils are equal, round, and reactive to light.  Neck: Normal range of motion. Neck supple.  Cardiovascular: Normal rate and regular rhythm.   Pulmonary/Chest: Effort normal and breath sounds normal. No respiratory distress. She has no rales.  Abdominal: Soft. There is no tenderness. There is no rebound and no guarding.  Neurological: She is alert and oriented to person, place, and time.  Skin: Skin is warm.  Psychiatric: She has a normal mood and affect. Her behavior is normal.  ED Course  Procedures   Results for orders placed during the hospital encounter of 01/12/12  CBC      Component Value Range   WBC 6.8  4.0 - 10.5 (K/uL)   RBC 4.92  3.87 - 5.11 (MIL/uL)   Hemoglobin 15.4 (*) 12.0 - 15.0 (g/dL)   HCT 16.1  09.6 - 04.5 (%)   MCV 91.5  78.0 - 100.0 (fL)   MCH 31.3  26.0 - 34.0 (pg)   MCHC 34.2  30.0 - 36.0 (g/dL)   RDW 40.9  81.1 - 91.4 (%)   Platelets 220  150 - 400 (K/uL)  COMPREHENSIVE METABOLIC PANEL      Component Value Range   Sodium 140  135 - 145 (mEq/L)   Potassium 3.7  3.5 - 5.1 (mEq/L)   Chloride 108  96 - 112 (mEq/L)   CO2 22  19 - 32 (mEq/L)   Glucose, Bld 116 (*) 70 - 99 (mg/dL)   BUN 14  6 - 23 (mg/dL)   Creatinine,  Ser 7.82  0.50 - 1.10 (mg/dL)   Calcium 9.4  8.4 - 95.6 (mg/dL)   Total Protein 7.0  6.0 - 8.3 (g/dL)   Albumin 3.7  3.5 - 5.2 (g/dL)   AST 36  0 - 37 (U/L)   ALT 70 (*) 0 - 35 (U/L)   Alkaline Phosphatase 88  39 - 117 (U/L)   Total Bilirubin 0.5  0.3 - 1.2 (mg/dL)   GFR calc non Af Amer >90  >90 (mL/min)   GFR calc Af Amer >90  >90 (mL/min)  ETHANOL      Component Value Range   Alcohol, Ethyl (B) <11  0 - 11 (mg/dL)        1. Narcotic abuse       MDM  Patient seen and evaluated. Patient no acute distress.   Spoke with Aurther Loft with [facility] act team. She has called our cuff and they may have a spot available. She will submit paperwork. Patient will be moved psych ED with psych holding.        Angus Seller, [place] 01/13/12 [phone]

## 2012-01-13 NOTE — ED Notes (Signed)
Pt's Clinical Opiate Withdrawal Scale (COWS) score at 1010 was: 7, notified ACT team with score.

## 2012-01-13 NOTE — Discharge Instructions (Signed)
Go to [facility] now. °

## 2012-01-13 NOTE — ED Provider Notes (Signed)
Patient is currently calm, comfortable, and wants to get treatment for her narcotics addiction. ACT is working with her.  Flint Melter, MD 01/13/12 1028

## 2012-01-13 NOTE — BH Assessment (Signed)
Assessment Note   Marissa Beasley is a 31 y.o. female who presents to [facility] for Heroin detox.  Pt denies SI/HI/Psych.  Pt was assessed on 01/12/12 at [facility] and sent to emergency dept for med clearance, however pt left after preferred placement had no availability([facility]).  Pt d/c'd from hospital at approx 4 pm and visited with her children and returned to [facility] at approx 11pm.  Pt admits to using $10 heroin.  Pt maybe minimizing use, however pt states is willing to accept placement at any facility that will help her with SA.  Pt not c/o any w/d sxs at this time.    Axis I: Opioid Dep Axis II: Deferred Axis III:  Past Medical History  Diagnosis Date  . Gestational diabetes   . Bipolar disorder   . ADD (attention deficit disorder)   . Depression   . Heroin addiction    Axis IV: economic problems, housing problems, other psychosocial or environmental problems, problems related to legal system/crime, problems related to social environment and problems with primary support group Axis V: 41-50 serious symptoms  Past Medical History:  Past Medical History  Diagnosis Date  . Gestational diabetes   . Bipolar disorder   . ADD (attention deficit disorder)   . Depression   . Heroin addiction     Past Surgical History  Procedure Date  . Cholecystectomy 2011  . Tubal ligation 2010    Family History: No family history on file.  Social History:  reports that she has been smoking Cigarettes.  She has been smoking about 2 packs per day. She has never used smokeless tobacco. She reports that she uses illicit drugs (Heroin) about 7 times per week. She reports that she does not drink alcohol.  Additional Social History:  Alcohol / Drug Use Pain Medications: None  Prescriptions: None  Over the Counter: None  History of alcohol / drug use?: Yes Longest period of sobriety (when/how long): 5 years from 2007-2011 Negative Consequences of Use: Personal relationships;Financial;Work / School Substance  #1 Name of Substance 1: Heroin  1 - Age of First Use: 18 YOF  1 - Amount (size/oz): 1/2 Gram  1 - Frequency: Daily  1 - Duration: On-going  1 - Last Use / Amount: $10 on 01/12/12 minimizes last use   CIWA: CIWA-Ar BP: 110/53 mmHg Pulse Rate: 57  COWS: Clinical Opiate Withdrawal Scale (COWS) Resting Pulse Rate: Pulse Rate 80 or below Sweating: No report of chills or flushing Restlessness: Able to sit still Pupil Size: Pupils pinned or normal size for room light Bone or Joint Aches: Not present Runny Nose or Tearing: Not present GI Upset: No GI symptoms Tremor: No tremor Yawning: No yawning Anxiety or Irritability: None Gooseflesh Skin: Skin is smooth COWS Total Score: 0   Allergies:  Allergies  Allergen Reactions  . Tylenol (Acetaminophen) Rash    Home Medications:  (Not in a hospital admission)  OB/GYN Status:  Patient's last menstrual period was 01/07/2012.  General Assessment Data Location of Assessment: [facility] ED Living Arrangements: Other (Comment) (Homeless ) Can pt return to current living arrangement?: Yes Admission Status: Voluntary Is patient capable of signing voluntary admission?: Yes Transfer from: Acute Hospital Referral Source: MD  Education Status Is patient currently in school?: No Current Grade: None  Highest grade of school patient has completed: None  Name of school: None  Contact person: None   Risk to self Suicidal Ideation: No Suicidal Intent: No Is patient at risk for suicide?: No Suicidal  Plan?: No Access to Means: No What has been your use of drugs/alcohol within the last 12 months?: DAily Abuse of Heroin  Previous Attempts/Gestures: No How many times?: 0  Other Self Harm Risks: None Reported  Triggers for Past Attempts: None known Intentional Self Injurious Behavior: None Family Suicide History: No Recent stressful life event(s): Legal Issues;Financial Problems (Homeless 2-3 mos; Upcoming court dates ) Persecutory  voices/beliefs?: No Depression: Yes Depression Symptoms: Insomnia;Isolating;Loss of interest in usual pleasures;Feeling worthless/self pity;Guilt Substance abuse history and/or treatment for substance abuse?: Yes Suicide prevention information given to non-admitted patients: Not applicable  Risk to Others Homicidal Ideation: No Thoughts of Harm to Others: No Current Homicidal Intent: No Current Homicidal Plan: No Access to Homicidal Means: No Identified Victim: None  History of harm to others?: No Assessment of Violence: None Noted Violent Behavior Description: Calm/Coooperative  Does patient have access to weapons?: No Criminal Charges Pending?: Yes Describe Pending Criminal Charges: Hit/Run; Possession of drug paraphernalia  Does patient have a court date: Yes Court Date:  (01/2012; 02/2012)  Psychosis Hallucinations: None noted Delusions: None noted  Mental Status Report Appear/Hygiene: Disheveled Eye Contact: Fair Motor Activity: Unremarkable Speech: Logical/coherent Level of Consciousness: Alert Mood: Depressed;Sad Affect: Depressed;Sad Anxiety Level: None Thought Processes: Coherent;Relevant Judgement: Unimpaired Orientation: Person;Place;Time;Situation Obsessive Compulsive Thoughts/Behaviors: None  Cognitive Functioning Concentration: Decreased Memory: Recent Intact;Remote Intact IQ: Average Insight: Fair Impulse Control: Fair Appetite: Fair Weight Loss: 0  Weight Gain: 0  Sleep: Decreased Total Hours of Sleep: 6  Vegetative Symptoms: None  ADLScreening [facility] Assessment Services) Patient's cognitive ability adequate to safely complete daily activities?: Yes Patient able to express need for assistance with ADLs?: Yes Independently performs ADLs?: Yes  Abuse/Neglect [facility]) Physical Abuse: Denies Verbal Abuse: Denies Sexual Abuse: Denies  Prior Inpatient Therapy Prior Inpatient Therapy: Yes Prior Therapy Dates: 2012-2013 Prior Therapy  Facilty/Provider(s): [facility]; [facility]  Reason for Treatment: Heroin addiction  Prior Outpatient Therapy Prior Outpatient Therapy: Yes Prior Therapy Dates: 2013 Prior Therapy Facilty/Provider(s): [facility]  Reason for Treatment: Bipolar D/O   ADL Screening (condition at time of admission) Patient's cognitive ability adequate to safely complete daily activities?: Yes Patient able to express need for assistance with ADLs?: Yes Independently performs ADLs?: Yes Weakness of Legs: None Weakness of Arms/Hands: None  Home Assistive Devices/Equipment Home Assistive Devices/Equipment: Eyeglasses  Therapy Consults (therapy consults require a physician order) PT Evaluation Needed: No OT Evalulation Needed: No SLP Evaluation Needed: No Abuse/Neglect Assessment (Assessment to be complete while patient is alone) Physical Abuse: Denies Verbal Abuse: Denies Sexual Abuse: Denies Exploitation of patient/patient's resources: Denies Self-Neglect: Denies Values / Beliefs Cultural Requests During Hospitalization: None Spiritual Requests During Hospitalization: None Consults Spiritual Care Consult Needed: No Social Work Consult Needed: No Merchant navy officer (For Healthcare) Advance Directive: Patient does not have advance directive;Patient would not like information Pre-existing out of facility DNR order (yellow form or pink MOST form): No Nutrition Screen Diet: Regular Unintentional weight loss greater than 10lbs within the last month: No Problems chewing or swallowing foods and/or liquids: No Home Tube Feeding or Total Parenteral Nutrition (TPN): No Patient appears severely malnourished: No Pregnant or Lactating: No  Additional Information 1:1 In Past 12 Months?: No CIRT Risk: No Elopement Risk: No Does patient have medical clearance?: Yes     Disposition:  Disposition Disposition of Patient: Inpatient treatment program;Referred to ([facility]) Type of inpatient treatment program:  Adult Patient referred to: [facility]  On Site Evaluation by:   Reviewed with Physician:     Sharol Harness,  Charlann Boxer 01/13/2012 4:08 AM

## 2012-01-13 NOTE — ED Provider Notes (Signed)
Medical screening examination/treatment/procedure(s) were performed by non-physician practitioner and as supervising physician I was immediately available for consultation/collaboration.   Lizvette Lightsey B. Bernette Mayers, MD 01/13/12 0730

## 2012-09-14 ENCOUNTER — Encounter (HOSPITAL_COMMUNITY): Payer: Self-pay | Admitting: Emergency Medicine

## 2012-09-14 ENCOUNTER — Emergency Department (HOSPITAL_COMMUNITY)
Admission: EM | Admit: 2012-09-14 | Discharge: 2012-09-14 | Disposition: A | Discharge status: Home / Self Care | Payer: Self-pay | Attending: Emergency Medicine | Admitting: Emergency Medicine

## 2012-09-14 DIAGNOSIS — Z79899 Other long term (current) drug therapy: Secondary | ICD-10-CM | POA: Insufficient documentation

## 2012-09-14 DIAGNOSIS — F1110 Opioid abuse, uncomplicated: Secondary | ICD-10-CM | POA: Insufficient documentation

## 2012-09-14 DIAGNOSIS — Z8659 Personal history of other mental and behavioral disorders: Secondary | ICD-10-CM | POA: Insufficient documentation

## 2012-09-14 DIAGNOSIS — B9689 Other specified bacterial agents as the cause of diseases classified elsewhere: Secondary | ICD-10-CM

## 2012-09-14 DIAGNOSIS — N760 Acute vaginitis: Secondary | ICD-10-CM | POA: Insufficient documentation

## 2012-09-14 DIAGNOSIS — Z8632 Personal history of gestational diabetes: Secondary | ICD-10-CM | POA: Insufficient documentation

## 2012-09-14 DIAGNOSIS — N390 Urinary tract infection, site not specified: Secondary | ICD-10-CM | POA: Insufficient documentation

## 2012-09-14 DIAGNOSIS — F319 Bipolar disorder, unspecified: Secondary | ICD-10-CM | POA: Insufficient documentation

## 2012-09-14 DIAGNOSIS — F17200 Nicotine dependence, unspecified, uncomplicated: Secondary | ICD-10-CM | POA: Insufficient documentation

## 2012-09-14 LAB — URINALYSIS, ROUTINE W REFLEX MICROSCOPIC
Bilirubin Urine: NEGATIVE
Glucose, UA: NEGATIVE mg/dL
Ketones, ur: NEGATIVE mg/dL
Leukocytes, UA: NEGATIVE
Protein, ur: NEGATIVE mg/dL
pH: 6.5 (ref 5.0–8.0)

## 2012-09-14 LAB — WET PREP, GENITAL
Trich, Wet Prep: NONE SEEN
Yeast Wet Prep HPF POC: NONE SEEN

## 2012-09-14 MED ORDER — CEPHALEXIN 500 MG PO CAPS: 500.0000 mg | ORAL_CAPSULE | Freq: Two times a day (BID) | ORAL | Status: DC

## 2012-09-14 MED ORDER — METRONIDAZOLE 500 MG PO TABS: 500.0000 mg | ORAL_TABLET | Freq: Two times a day (BID) | ORAL | Status: DC

## 2012-09-14 NOTE — Progress Notes (Signed)
[facility] ED CM noted RN note of recurrent BV.  CM spoke with pt to provide her with STD testing information for [employer] of public health. She reports she will be getting insurance through her job that will be effective October 02 2012. Reviewed self pay pcps for [place] related to discount cost to use until her coverage effective in march 2014

## 2012-09-14 NOTE — ED Notes (Signed)
States that she is pretty sure that she has BV. States that she always gets it after her menstrual cycle.

## 2012-09-14 NOTE — ED Provider Notes (Signed)
History     CSN: [id number]  Arrival date & time 09/14/12  1705   First MD Initiated Contact with Patient 09/14/12 1728      Chief Complaint  Patient presents with  . Vaginitis    (Consider location/radiation/quality/duration/timing/severity/associated sxs/prior treatment) HPI  32 year old female presents emergency department chief complaint of "bacterial vaginosis."  Patient presents with 24 hours of vaginal odor and discharge.  She has history of multiple bacterial vaginosis infections.  This frequently occurs after her menses.  Menses ended 2 days ago.  Patient denies other vaginal symptoms as does his arrhythmia, suprapubic pain, pruritus.  She denies urinary symptoms.  In a monogamous relationship.  Uses latex condoms for birth control.  Past Medical History  Diagnosis Date  . Gestational diabetes   . Bipolar disorder   . ADD (attention deficit disorder)   . Depression   . Heroin addiction     Past Surgical History  Procedure Laterality Date  . Cholecystectomy  2011  . Tubal ligation  2010    No family history on file.  History  Substance Use Topics  . Smoking status: Current Every Day Smoker -- 2.00 packs/day    Types: Cigarettes  . Smokeless tobacco: Never Used  . Alcohol Use: No    OB History   Grav Para Term Preterm Abortions TAB SAB Ect Mult Living                  Review of Systems Ten systems reviewed and are negative for acute change, except as noted in the HPI.   Allergies  Tylenol  Home Medications   Current Outpatient Rx  Name  Route  Sig  Dispense  Refill  . citalopram (CELEXA) 40 MG tablet   Oral   Take 40 mg by mouth daily.         Marland Kitchen lamoTRIgine (LAMICTAL) 100 MG tablet   Oral   Take 100 mg by mouth daily.          . traZODone (DESYREL) 100 MG tablet   Oral   Take 100 mg by mouth at bedtime.            BP 104/64  Pulse 60  Temp(Src) 98.2 F (36.8 C) (Oral)  Resp 17  SpO2 99%  LMP 09/13/2012  Physical  Exam Physical Exam  Nursing note and vitals reviewed. Constitutional: She is oriented to person, place, and time. She appears well-developed and well-nourished. No distress.  HENT:  Head: Normocephalic and atraumatic.  Eyes: Conjunctivae normal and EOM are normal. Pupils are equal, round, and reactive to light. No scleral icterus.  Neck: Normal range of motion.  Cardiovascular: Normal rate, regular rhythm and normal heart sounds.  Exam reveals no gallop and no friction rub.   No murmur heard. Pulmonary/Chest: Effort normal and breath sounds normal. No respiratory distress.  Abdominal: Soft. Bowel sounds are normal. She exhibits no distension and no mass. There is no tenderness. There is no guarding.  Neurological: She is alert and oriented to person, place, and time.  Skin: Skin is warm and dry. She is not diaphoretic.  Pelvic exam: normal external genitalia, vulva, vagina (no abnormal discharge or foul odor), cervix (multiparous, no ectropion or discharge, NO CMT), uterus and adnexa.   ED Course  Procedures (including critical care time)  Labs Reviewed  WET PREP, GENITAL - Abnormal; Notable for the following:    Clue Cells Wet Prep HPF POC FEW (*)    WBC, Wet Prep HPF POC FEW (*)  All other components within normal limits  URINALYSIS, ROUTINE W REFLEX MICROSCOPIC - Abnormal; Notable for the following:    Nitrite POSITIVE (*)    All other components within normal limits  URINE MICROSCOPIC-ADD ON - Abnormal; Notable for the following:    Bacteria, UA MANY (*)    All other components within normal limits  GC/CHLAMYDIA PROBE AMP   No results found.   1. UTI (lower urinary tract infection)   2. BV (bacterial vaginosis)       MDM  6:18 PM Patient with likely bv. Wet prep pending. No concern for PID. Labs show BV and UTI. Will d/c with keflex and metronidazole. [facility]       Arthor Captain, PA-C 09/17/12 1622

## 2012-09-15 LAB — GC/CHLAMYDIA PROBE AMP: GC Probe RNA: NEGATIVE

## 2012-09-17 NOTE — ED Provider Notes (Signed)
Medical screening examination/treatment/procedure(s) were performed by non-physician practitioner and as supervising physician I was immediately available for consultation/collaboration.   Lyanne Co, MD 09/17/12 [phone]

## 2013-06-12 ENCOUNTER — Emergency Department: Payer: Self-pay | Admitting: Emergency Medicine

## 2013-06-12 LAB — COMPREHENSIVE METABOLIC PANEL
Alkaline Phosphatase: 96 U/L (ref 50–136)
Anion Gap: 5 — ABNORMAL LOW (ref 7–16)
BUN: 12 mg/dL (ref 7–18)
Bilirubin,Total: 0.7 mg/dL (ref 0.2–1.0)
Calcium, Total: 9.6 mg/dL (ref 8.5–10.1)
Co2: 27 mmol/L (ref 21–32)
EGFR (African American): >60
EGFR (Non-African Amer.): >60
Osmolality: 280 (ref 275–301)
Potassium: 4.1 mmol/L (ref 3.5–5.1)

## 2013-06-12 LAB — CBC
HCT: 48.2 % — ABNORMAL HIGH (ref 35.0–47.0)
MCH: 31 pg (ref 26.0–34.0)
RBC: 5.36 10*6/uL — ABNORMAL HIGH (ref 3.80–5.20)
RDW: 13 % (ref 11.5–14.5)
WBC: 9.6 10*3/uL (ref 3.6–11.0)

## 2013-06-12 LAB — URINALYSIS, COMPLETE
Blood: NEGATIVE
Glucose,UR: NEGATIVE mg/dL (ref 0–75)
Ph: 5 (ref 4.5–8.0)
Protein: NEGATIVE
RBC,UR: 2 /HPF (ref 0–5)
Squamous Epithelial: 4

## 2013-06-12 LAB — DRUG SCREEN, URINE
Barbiturates, Ur Screen: NEGATIVE (ref ?–200)
Benzodiazepine, Ur Scrn: NEGATIVE (ref ?–200)
Cannabinoid 50 Ng, Ur ~~LOC~~: NEGATIVE (ref ?–50)
Cocaine Metabolite,Ur ~~LOC~~: POSITIVE (ref ?–300)
MDMA (Ecstasy)Ur Screen: NEGATIVE (ref ?–500)
Methadone, Ur Screen: NEGATIVE (ref ?–300)
Phencyclidine (PCP) Ur S: NEGATIVE (ref ?–25)

## 2013-07-02 ENCOUNTER — Encounter (HOSPITAL_BASED_OUTPATIENT_CLINIC_OR_DEPARTMENT_OTHER): Payer: Self-pay | Admitting: Emergency Medicine

## 2013-07-02 ENCOUNTER — Emergency Department (HOSPITAL_BASED_OUTPATIENT_CLINIC_OR_DEPARTMENT_OTHER)
Admission: EM | Admit: 2013-07-02 | Discharge: 2013-07-02 | Disposition: A | Discharge status: Home / Self Care | Payer: Medicaid Other | Attending: Emergency Medicine | Admitting: Emergency Medicine

## 2013-07-02 DIAGNOSIS — A499 Bacterial infection, unspecified: Secondary | ICD-10-CM | POA: Insufficient documentation

## 2013-07-02 DIAGNOSIS — Z79899 Other long term (current) drug therapy: Secondary | ICD-10-CM | POA: Insufficient documentation

## 2013-07-02 DIAGNOSIS — Z792 Long term (current) use of antibiotics: Secondary | ICD-10-CM | POA: Insufficient documentation

## 2013-07-02 DIAGNOSIS — N760 Acute vaginitis: Secondary | ICD-10-CM | POA: Insufficient documentation

## 2013-07-02 DIAGNOSIS — Z8632 Personal history of gestational diabetes: Secondary | ICD-10-CM | POA: Insufficient documentation

## 2013-07-02 DIAGNOSIS — F319 Bipolar disorder, unspecified: Secondary | ICD-10-CM | POA: Insufficient documentation

## 2013-07-02 DIAGNOSIS — B9689 Other specified bacterial agents as the cause of diseases classified elsewhere: Secondary | ICD-10-CM | POA: Insufficient documentation

## 2013-07-02 DIAGNOSIS — F17200 Nicotine dependence, unspecified, uncomplicated: Secondary | ICD-10-CM | POA: Insufficient documentation

## 2013-07-02 DIAGNOSIS — A5909 Other urogenital trichomoniasis: Secondary | ICD-10-CM

## 2013-07-02 LAB — WET PREP, GENITAL: Yeast Wet Prep HPF POC: NONE SEEN

## 2013-07-02 MED ORDER — METRONIDAZOLE 500 MG PO TABS: 500.0000 mg | ORAL_TABLET | Freq: Two times a day (BID) | ORAL | Status: DC

## 2013-07-02 NOTE — ED Notes (Signed)
Patient c/o vaginal discharge & odor for the past two days, no problems urinating

## 2013-07-02 NOTE — ED Provider Notes (Signed)
CSN: [id number]     Arrival date & time 07/02/13  0865 History   First MD Initiated Contact with Patient 07/02/13 0730     Chief Complaint  Patient presents with  . Vaginal Discharge   (Consider location/radiation/quality/duration/timing/severity/associated sxs/prior Treatment) Patient is a 32 y.o. female presenting with vaginal discharge.  Vaginal Discharge Associated symptoms: no dysuria and no fever     32 year old female here with vaginal discharge for 2 days. She states that she frequently gets BV after her periods. She notes thin clear discharge the last 2 days which started abruptly after her menses ended. She also endorses vaginal itching and foul smell to the discharge. She sexually active for one partner for the last year, and has unprotected sex with him. She declines HIV and syphilis testing and she states that she just was tested recently. She denies fevers, chills, sweats, abdominal pain, nausea, and vomiting.  Past Medical History  Diagnosis Date  . Gestational diabetes   . Bipolar disorder   . ADD (attention deficit disorder)   . Depression   . Heroin addiction    Past Surgical History  Procedure Laterality Date  . Cholecystectomy  2011  . Tubal ligation  2010   No family history on file. History  Substance Use Topics  . Smoking status: Current Every Day Smoker -- 2.00 packs/day    Types: Cigarettes  . Smokeless tobacco: Never Used  . Alcohol Use: No   OB History   Grav Para Term Preterm Abortions TAB SAB Ect Mult Living                 Review of Systems  Constitutional: Negative for fever.  Genitourinary: Positive for vaginal discharge. Negative for dysuria and pelvic pain.  All other systems reviewed and are negative.    Allergies  Tylenol  Home Medications   Current Outpatient Rx  Name  Route  Sig  Dispense  Refill  . cephALEXin (KEFLEX) 500 MG capsule   Oral   Take 1 capsule (500 mg total) by mouth 2 (two) times daily.   20 capsule   0    . citalopram (CELEXA) 40 MG tablet   Oral   Take 40 mg by mouth daily.         Marland Kitchen lamoTRIgine (LAMICTAL) 100 MG tablet   Oral   Take 100 mg by mouth daily.          . metroNIDAZOLE (FLAGYL) 500 MG tablet   Oral   Take 1 tablet (500 mg total) by mouth 2 (two) times daily. One po bid x 7 days   14 tablet   0   . traZODone (DESYREL) 100 MG tablet   Oral   Take 100 mg by mouth at bedtime.           BP 106/64  Pulse 78  Temp(Src) 98.3 F (36.8 C) (Oral)  Resp 18  SpO2 97% Physical Exam  Nursing note and vitals reviewed. Constitutional: She is oriented to person, place, and time. She appears well-developed and well-nourished. No distress.  HENT:  Head: Normocephalic and atraumatic.  Eyes: EOM are normal. Pupils are equal, round, and reactive to light.  Neck: Neck supple.  Cardiovascular: Normal rate, regular rhythm and normal heart sounds.   No murmur heard. Pulmonary/Chest: Effort normal and breath sounds normal.  Abdominal: Soft. Bowel sounds are normal. There is no tenderness. There is no guarding.  Genitourinary: There is no lesion on the right labia. There is no lesion on  the left labia. Cervix exhibits discharge. Cervix exhibits no motion tenderness and no friability. Right adnexum displays no mass, no tenderness and no fullness. Left adnexum displays no mass, no tenderness and no fullness.  Musculoskeletal: She exhibits no edema.  Neurological: She is alert and oriented to person, place, and time.  Skin: Skin is warm and dry. She is not diaphoretic.  Psychiatric: She has a normal mood and affect.    ED Course  Procedures (including critical care time) Labs Review Labs Reviewed  WET PREP, GENITAL - Abnormal; Notable for the following:    Trich, Wet Prep MANY (*)    Clue Cells Wet Prep HPF POC FEW (*)    WBC, Wet Prep HPF POC MODERATE (*)    All other components within normal limits  GC/CHLAMYDIA PROBE AMP   Imaging Review No results found.  EKG  Interpretation   None       MDM   1. Trichomonal cervicitis   2. BV (bacterial vaginosis)     32 year old female with history of frequent BV here with vaginal discharge. On exam shows no cervical motion tenderness and she is overall nontoxic appearing. Speculum exam reveals moderate thick white discharge from the cervix. GCC wet prep collected. Wet prep shows Trichomonas and clue cells. We'll treat with 7 days of twice a day Flagyl. Discussed safe sex and treatment of partner.  Red flags provided, return for worsening symptoms.   Murtis Sink, MD [facility] Family Medicine Resident, PGY-2 07/02/2013, 9:02 AM       Elenora Gamma, MD 07/02/13 [phone]

## 2013-07-03 NOTE — ED Provider Notes (Signed)
I saw and evaluated the patient, reviewed the resident's note and I agree with the findings and plan.   .Face to face Exam:  General:  Awake HEENT:  Atraumatic Resp:  Normal effort Abd:  Nondistended Neuro:No focal weakness  Kamerin Axford L Marialena Wollen, MD 07/03/13 0725 

## 2013-07-04 LAB — GC/CHLAMYDIA PROBE AMP
CT Probe RNA: NEGATIVE
GC Probe RNA: NEGATIVE

## 2013-07-20 ENCOUNTER — Emergency Department (HOSPITAL_COMMUNITY)
Admission: EM | Admit: 2013-07-20 | Discharge: 2013-07-20 | Disposition: A | Discharge status: Home / Self Care | Payer: Medicaid Other | Attending: Emergency Medicine | Admitting: Emergency Medicine

## 2013-07-20 ENCOUNTER — Encounter (HOSPITAL_COMMUNITY): Payer: Self-pay | Admitting: Emergency Medicine

## 2013-07-20 DIAGNOSIS — Y929 Unspecified place or not applicable: Secondary | ICD-10-CM | POA: Insufficient documentation

## 2013-07-20 DIAGNOSIS — W2203XA Walked into furniture, initial encounter: Secondary | ICD-10-CM | POA: Insufficient documentation

## 2013-07-20 DIAGNOSIS — Z79899 Other long term (current) drug therapy: Secondary | ICD-10-CM | POA: Insufficient documentation

## 2013-07-20 DIAGNOSIS — L03115 Cellulitis of right lower limb: Secondary | ICD-10-CM

## 2013-07-20 DIAGNOSIS — F329 Major depressive disorder, single episode, unspecified: Secondary | ICD-10-CM | POA: Insufficient documentation

## 2013-07-20 DIAGNOSIS — Y9389 Activity, other specified: Secondary | ICD-10-CM | POA: Insufficient documentation

## 2013-07-20 DIAGNOSIS — F319 Bipolar disorder, unspecified: Secondary | ICD-10-CM | POA: Insufficient documentation

## 2013-07-20 DIAGNOSIS — F988 Other specified behavioral and emotional disorders with onset usually occurring in childhood and adolescence: Secondary | ICD-10-CM | POA: Insufficient documentation

## 2013-07-20 DIAGNOSIS — L02419 Cutaneous abscess of limb, unspecified: Secondary | ICD-10-CM | POA: Insufficient documentation

## 2013-07-20 DIAGNOSIS — F3289 Other specified depressive episodes: Secondary | ICD-10-CM | POA: Insufficient documentation

## 2013-07-20 MED ORDER — HYDROCODONE-IBUPROFEN 7.5-200 MG PO TABS: 1.0000 | ORAL_TABLET | Freq: Four times a day (QID) | ORAL | Status: DC | PRN

## 2013-07-20 MED ORDER — CEPHALEXIN 500 MG PO CAPS
1000.0000 mg | ORAL_CAPSULE | Freq: Once | ORAL | Status: AC
  Administered 2013-07-20: 1000 mg via ORAL
  Filled 2013-07-20: qty 2

## 2013-07-20 MED ORDER — OXYCODONE HCL 5 MG PO TABS
10.0000 mg | ORAL_TABLET | Freq: Once | ORAL | Status: AC
  Administered 2013-07-20: 10 mg via ORAL
  Filled 2013-07-20: qty 2

## 2013-07-20 MED ORDER — CEPHALEXIN 500 MG PO CAPS: 1000.0000 mg | ORAL_CAPSULE | Freq: Four times a day (QID) | ORAL | Status: DC

## 2013-07-20 NOTE — ED Provider Notes (Signed)
Medical screening examination/treatment/procedure(s) were performed by non-physician practitioner and as supervising physician I was immediately available for consultation/collaboration.    Bijan Ridgley M Ayda Tancredi, MD 07/20/13 0312 

## 2013-07-20 NOTE — ED Provider Notes (Signed)
CSN: [id number]     Arrival date & time 07/20/13  0112 History   First MD Initiated Contact with Patient 07/20/13 0126     Chief Complaint  Patient presents with  . Leg Pain   (Consider location/radiation/quality/duration/timing/severity/associated sxs/prior Treatment) HPI History provided by pt.   Pt presents w/ severe pain right anterior thigh since yesterday.  Aggravated by bearing weight and associated w/ erythema and warmth of skin.  Denies fever.  Accidentally walked into a table w/ impact at same location 2 days ago.  Immunocompetent. Past Medical History  Diagnosis Date  . Gestational diabetes   . Bipolar disorder   . ADD (attention deficit disorder)   . Depression   . Heroin addiction    Past Surgical History  Procedure Laterality Date  . Cholecystectomy  2011  . Tubal ligation  2010   Family History  Problem Relation Age of Onset  . Diabetes Other    History  Substance Use Topics  . Smoking status: Current Every Day Smoker -- 2.00 packs/day    Types: Cigarettes  . Smokeless tobacco: Never Used  . Alcohol Use: No   OB History   Grav Para Term Preterm Abortions TAB SAB Ect Mult Living                 Review of Systems  All other systems reviewed and are negative.    Allergies  Tylenol  Home Medications   Current Outpatient Rx  Name  Route  Sig  Dispense  Refill  . citalopram (CELEXA) 40 MG tablet   Oral   Take 40 mg by mouth daily.         Marland Kitchen lamoTRIgine (LAMICTAL) 100 MG tablet   Oral   Take 100 mg by mouth daily.          . traZODone (DESYREL) 100 MG tablet   Oral   Take 100 mg by mouth at bedtime.          Marland Kitchen HYDROcodone-ibuprofen (VICOPROFEN) 7.5-200 MG per tablet   Oral   Take 1 tablet by mouth every 6 (six) hours as needed for moderate pain.   20 tablet   0    BP 107/71  Pulse 87  Temp(Src) 98.4 F (36.9 C) (Oral)  Resp 18  Ht 5\' 7"  (1.702 m)  Wt 145 lb (65.772 kg)  BMI 22.71 kg/m2  SpO2 97%  LMP 07/19/2013 Physical  Exam  Nursing note and vitals reviewed. Constitutional: She is oriented to person, place, and time. She appears well-developed and well-nourished. No distress.  HENT:  Head: Normocephalic and atraumatic.  Eyes:  Normal appearance  Neck: Normal range of motion.  Pulmonary/Chest: Effort normal.  Musculoskeletal: Normal range of motion.  Neurological: She is alert and oriented to person, place, and time.  Skin:  12-14cm diameter area of erythema and warmth on right medial thigh.  Tiny, scabbed scratch overlying. Ttp.    Psychiatric: She has a normal mood and affect. Her behavior is normal.    ED Course  Procedures (including critical care time) Labs Review Labs Reviewed - No data to display Imaging Review No results found.  EKG Interpretation   None       MDM   1. Cellulitis of right thigh    Immunocompetent 32yo F presents w/ cellulitis, likely secondary to minor trauma, of right medial thigh.  She received first dose of keflex in ED and d/c'd home w/ same + 15 vicoprofen.  Return precautions discussed.  Arie Sabina Harshith Pursell, PA-C 07/20/13 0300

## 2013-07-20 NOTE — ED Notes (Signed)
Pt states she had a bruise on her right inner thigh and she states she hit and it got worse  Pt now has an area on her thigh about the size of a large grapefruit that is red and warm to touch

## 2013-10-23 NOTE — ED Notes (Signed)
[facility] (currently getting treatment for opioid usage). This evening, patient became upset and struck a metal elevator with right (dominant) hand. Patient reports pain to hand and wrist. Patient icing wound at time of triage.  Patient denies SI/HI, was just agitated.  Patient fully alert/oriented at time of triage.

## 2013-10-23 NOTE — ED Notes (Signed)
I have reviewed medications, follow up provider options, and discharge instructions with the patient. The patient verbalized understanding. Copy of discharge information given to patient upon discharge. Patient discharged ambulatory in no distress.

## 2013-10-23 NOTE — ED Provider Notes (Signed)
Patient is a 33 y.o. female presenting with hand pain. The history is provided by the patient.   Hand Pain   This is a new problem. The current episode started less than 1 hour ago. The problem occurs constantly. The problem has not changed since onset.The pain is present in the right wrist and right hand. The quality of the pain is described as aching. The pain is at a severity of 7/10. The pain is moderate. Associated symptoms include stiffness. Pertinent negatives include no numbness and no tingling. The symptoms are aggravated by activity and palpation. She has tried nothing for the symptoms. The treatment provided no relief. There has been a history of trauma.        No past medical history on file.     Past Surgical History   Procedure Laterality Date   ??? Hx cholecystectomy     ??? Hx gyn       tubal ligation         No family history on file.     History     Social History   ??? Marital Status: SINGLE     Spouse Name: N/A     Number of Children: N/A   ??? Years of Education: N/A     Occupational History   ??? Not on file.     Social History Main Topics   ??? Smoking status: Never Smoker    ??? Smokeless tobacco: Never Used   ??? Alcohol Use: No   ??? Drug Use: No   ??? Sexual Activity: Not on file     Other Topics Concern   ??? Not on file     Social History Narrative   ??? No narrative on file                  ALLERGIES: Review of patient's allergies indicates no known allergies.      Review of Systems   Musculoskeletal: Positive for stiffness.   Neurological: Negative for tingling and numbness.   All other systems reviewed and are negative.      Filed Vitals:    10/23/13 2046   BP: 122/70   Pulse: 78   Temp: 98 ??F (36.7 ??C)   Resp: 20   Height: 5\' 7"  (1.702 m)   Weight: 72.576 kg (160 lb)   SpO2: 98%            Physical Exam   Constitutional: She is oriented to person, place, and time. She appears well-developed and well-nourished. No distress.   HENT:   Head: Normocephalic and atraumatic.   Eyes: Conjunctivae and EOM are  normal. Pupils are equal, round, and reactive to light.   Neck: Normal range of motion. Neck supple.   Cardiovascular: Normal rate and regular rhythm.    Pulmonary/Chest: Effort normal and breath sounds normal. No respiratory distress. She has no wheezes.   Abdominal: Soft. Bowel sounds are normal.   Musculoskeletal: She exhibits tenderness. She exhibits no edema.   Rt hand with dorsal pain and bruising, no obvious deformity    Neurological: She is alert and oriented to person, place, and time.   Skin: Skin is warm.   Nursing note and vitals reviewed.       MDM  Number of Diagnoses or Management Options  Diagnosis management comments: Rt hand  And wrist x rays negative, pt on rehab program, no narcotics can be given, ice pack placed       Amount and/or Complexity of Data  Reviewed  Tests in the radiology section of CPT??: ordered and reviewed    Risk of Complications, Morbidity, and/or Mortality  Presenting problems: low  Diagnostic procedures: low  Management options: low    Patient Progress  Patient progress: improved      Procedures

## 2013-10-23 NOTE — ED Notes (Signed)
Ice pack applied to right hand

## 2013-10-24 MED ORDER — ACETAMINOPHEN 500 MG TAB
500 mg | ORAL | Status: AC
Start: 2013-10-24 — End: 2013-10-23
  Administered 2013-10-24: 02:00:00 via ORAL

## 2013-10-24 MED FILL — MAPAP EXTRA STRENGTH 500 MG TABLET: 500 mg | ORAL | Qty: 2

## 2014-04-17 ENCOUNTER — Encounter (HOSPITAL_COMMUNITY): Payer: Self-pay | Admitting: Emergency Medicine

## 2014-04-17 ENCOUNTER — Emergency Department (HOSPITAL_COMMUNITY)
Admission: EM | Admit: 2014-04-17 | Discharge: 2014-04-17 | Disposition: A | Discharge status: Home / Self Care | Payer: Medicaid Other | Attending: Emergency Medicine | Admitting: Emergency Medicine

## 2014-04-17 DIAGNOSIS — Z79899 Other long term (current) drug therapy: Secondary | ICD-10-CM | POA: Insufficient documentation

## 2014-04-17 DIAGNOSIS — Z792 Long term (current) use of antibiotics: Secondary | ICD-10-CM | POA: Insufficient documentation

## 2014-04-17 DIAGNOSIS — L02214 Cutaneous abscess of groin: Secondary | ICD-10-CM

## 2014-04-17 DIAGNOSIS — Z8632 Personal history of gestational diabetes: Secondary | ICD-10-CM | POA: Insufficient documentation

## 2014-04-17 DIAGNOSIS — L02219 Cutaneous abscess of trunk, unspecified: Secondary | ICD-10-CM | POA: Insufficient documentation

## 2014-04-17 DIAGNOSIS — F319 Bipolar disorder, unspecified: Secondary | ICD-10-CM | POA: Insufficient documentation

## 2014-04-17 DIAGNOSIS — L03319 Cellulitis of trunk, unspecified: Principal | ICD-10-CM

## 2014-04-17 DIAGNOSIS — F17200 Nicotine dependence, unspecified, uncomplicated: Secondary | ICD-10-CM | POA: Insufficient documentation

## 2014-04-17 MED ORDER — SULFAMETHOXAZOLE-TRIMETHOPRIM 800-160 MG PO TABS: 1.0000 | ORAL_TABLET | Freq: Two times a day (BID) | ORAL | Status: DC

## 2014-04-17 MED ORDER — LIDOCAINE HCL 2 % IJ SOLN
5.0000 mL | Freq: Once | INTRAMUSCULAR | Status: AC
  Administered 2014-04-17: 100 mg

## 2014-04-17 NOTE — ED Notes (Signed)
Pt presents with ?abscess in her vaginal area.  Redness noted.

## 2014-04-17 NOTE — ED Provider Notes (Signed)
Medical screening examination/treatment/procedure(s) were performed by non-physician practitioner and as supervising physician I was immediately available for consultation/collaboration.   EKG Interpretation None       Arby Barrette, MD 04/17/14 2110

## 2014-04-17 NOTE — ED Notes (Signed)
Knows to take entire antibiotic regimen. No other questions/concerns.  

## 2014-04-17 NOTE — Discharge Instructions (Signed)

## 2014-04-17 NOTE — ED Provider Notes (Signed)
CSN: [id number]     Arrival date & time 04/17/14  1649 History  This chart was scribed for non-physician practitioner, Terri Piedra, PA-C working with Arby Barrette, MD by Greggory Stallion, ED scribe. This patient was seen in room WTR8/WTR8 and the patient's care was started at 7:03 PM.   Chief Complaint  Patient presents with  . Abscess   The history is provided by the patient. No language interpreter was used.   HPI Comments: LANDRI DORSAINVIL is a 33 y.o. female who presents to the Emergency Department complaining of an abscess to her right pelvic area that started 2 days ago. Reports increased pain, swelling and redness. She has not done anything for her symptoms. Denies drainage from the area. Pt has had one abscess several years ago. Denies fever, chills, nausea, emesis. Reports history of gestational diabetes.   Past Medical History  Diagnosis Date  . Gestational diabetes   . Bipolar disorder   . ADD (attention deficit disorder)   . Depression   . Heroin addiction    Past Surgical History  Procedure Laterality Date  . Cholecystectomy  2011  . Tubal ligation  2010   Family History  Problem Relation Age of Onset  . Diabetes Other    History  Substance Use Topics  . Smoking status: Current Every Day Smoker -- 2.00 packs/day    Types: Cigarettes  . Smokeless tobacco: Never Used  . Alcohol Use: No   OB History   Grav Para Term Preterm Abortions TAB SAB Ect Mult Living                 Review of Systems A complete 10 system review of systems was obtained and all systems are negative except as noted in the HPI and PMH.   Allergies  Tylenol  Home Medications   Prior to Admission medications   Medication Sig Start Date End Date Taking? Authorizing Provider  cephALEXin (KEFLEX) 500 MG capsule Take 2 capsules (1,000 mg total) by mouth 4 (four) times daily. 07/20/13   Arie Sabina Schinlever, PA-C  citalopram (CELEXA) 40 MG tablet Take 40 mg by mouth daily.     Historical Provider, MD  HYDROcodone-ibuprofen (VICOPROFEN) 7.5-200 MG per tablet Take 1 tablet by mouth every 6 (six) hours as needed for moderate pain. 07/20/13   Catherine E Schinlever, PA-C  HYDROcodone-ibuprofen (VICOPROFEN) 7.5-200 MG per tablet Take 1 tablet by mouth every 6 (six) hours as needed for moderate pain. 07/20/13   Arie Sabina Schinlever, PA-C  lamoTRIgine (LAMICTAL) 100 MG tablet Take 100 mg by mouth daily.     Historical Provider, MD  sulfamethoxazole-trimethoprim (SEPTRA DS) 800-160 MG per tablet Take 1 tablet by mouth 2 (two) times daily. 04/17/14   Hedwig Mcfall A Forcucci, PA-C  traZODone (DESYREL) 100 MG tablet Take 100 mg by mouth at bedtime.     Historical Provider, MD   BP 110/61  Pulse 72  Temp(Src) 98.3 F (36.8 C) (Oral)  Resp 20  SpO2 100%  LMP 03/27/2014  Physical Exam  Nursing note and vitals reviewed. Constitutional: She is oriented to person, place, and time. She appears well-developed and well-nourished. No distress.  HENT:  Head: Normocephalic and atraumatic.  Eyes: Conjunctivae and EOM are normal.  Neck: Neck supple.  Cardiovascular: Normal rate, regular rhythm and normal heart sounds.  Exam reveals no gallop and no friction rub.   No murmur heard. Pulmonary/Chest: Effort normal and breath sounds normal. No respiratory distress. She has no wheezes. She has no  rhonchi. She has no rales.  Musculoskeletal: Normal range of motion.  Neurological: She is alert and oriented to person, place, and time.  Skin: Skin is warm and dry. There is erythema.  3 cm x 1 cm erythematous nodule. Central fluctuance with peripheral erythema. No active drainage or induration. Tender to palpation.   Psychiatric: She has a normal mood and affect. Her behavior is normal.   ED Course  Procedures (including critical care time)  INCISION AND DRAINAGE Performed by: Terri Piedra, PA-C Consent: Verbal consent obtained. Risks and benefits: risks, benefits and alternatives  were discussed Type: abscess  Body area: right pelvic region  Anesthesia: local infiltration  Incision was made with a scalpel.  Local anesthetic: lidocaine 2% with epinephrine  Anesthetic total: 4 ml  Complexity: complex Blunt dissection to break up loculations  Drainage: purulent  Drainage amount: copious   Packing material: none  Patient tolerance: Patient tolerated the procedure well with no immediate complications.   DIAGNOSTIC STUDIES: Oxygen Saturation is 100% on RA, normal by my interpretation.    COORDINATION OF CARE: 7:06 PM-Discussed treatment plan which includes I&D and an antibiotic with pt at bedside and pt agreed to plan.   Labs Review Labs Reviewed - No data to display  Imaging Review No results found.   EKG Interpretation None      MDM   Final diagnoses:  Groin abscess   Patient is a 33 y.o. Female who presents to the ED with right groin abscess.  There is minimal surrounding erythema on exam.  Abscess was drained as seen above.  Given recurrent nature will treat with Bactrim DS BID x 7 days.   Patient to return for fever, chills, nausea, vomiting, streaking.  Patient states understanding and agreement.  She was told to use warm sitz baths and warm compresses.  Patient told to have wound rechecked in 2 days.  She states understanding and agreement to the above plan.  Patient is stable for discharge.    I personally performed the services described in this documentation, which was scribed in my presence. The recorded information has been reviewed and is accurate.  Eben Burow, PA-C 04/17/14 1954

## 2017-09-27 ENCOUNTER — Other Ambulatory Visit (HOSPITAL_COMMUNITY): Payer: Self-pay | Admitting: Psychiatry

## 2017-11-14 ENCOUNTER — Inpatient Hospital Stay (HOSPITAL_COMMUNITY)
Admission: AD | Admit: 2017-11-14 | Discharge: 2017-11-16 | DRG: 885 | Disposition: A | Discharge status: Home / Self Care | Payer: No Typology Code available for payment source | Source: Intra-hospital | Attending: Psychiatry | Admitting: Psychiatry

## 2017-11-14 ENCOUNTER — Ambulatory Visit (HOSPITAL_COMMUNITY)
Admission: RE | Admit: 2017-11-14 | Discharge: 2017-11-14 | Disposition: A | Discharge status: Home / Self Care | Payer: No Typology Code available for payment source | Attending: Psychiatry | Admitting: Psychiatry

## 2017-11-14 ENCOUNTER — Other Ambulatory Visit: Payer: Self-pay

## 2017-11-14 ENCOUNTER — Encounter (HOSPITAL_COMMUNITY): Payer: Self-pay | Admitting: Emergency Medicine

## 2017-11-14 ENCOUNTER — Encounter (HOSPITAL_COMMUNITY): Payer: Self-pay

## 2017-11-14 ENCOUNTER — Emergency Department (HOSPITAL_COMMUNITY)
Admission: EM | Admit: 2017-11-14 | Discharge: 2017-11-14 | Disposition: A | Discharge status: Other Acute Inpatient Hospital | Payer: Self-pay | Attending: Emergency Medicine | Admitting: Emergency Medicine

## 2017-11-14 DIAGNOSIS — Z1330 Encounter for screening examination for mental health and behavioral disorders, unspecified: Secondary | ICD-10-CM | POA: Diagnosis present

## 2017-11-14 DIAGNOSIS — Z79899 Other long term (current) drug therapy: Secondary | ICD-10-CM | POA: Insufficient documentation

## 2017-11-14 DIAGNOSIS — Z915 Personal history of self-harm: Secondary | ICD-10-CM | POA: Diagnosis not present

## 2017-11-14 DIAGNOSIS — F419 Anxiety disorder, unspecified: Secondary | ICD-10-CM | POA: Diagnosis present

## 2017-11-14 DIAGNOSIS — Z046 Encounter for general psychiatric examination, requested by authority: Secondary | ICD-10-CM | POA: Insufficient documentation

## 2017-11-14 DIAGNOSIS — F17210 Nicotine dependence, cigarettes, uncomplicated: Secondary | ICD-10-CM | POA: Insufficient documentation

## 2017-11-14 DIAGNOSIS — F1120 Opioid dependence, uncomplicated: Secondary | ICD-10-CM | POA: Diagnosis present

## 2017-11-14 DIAGNOSIS — F1510 Other stimulant abuse, uncomplicated: Secondary | ICD-10-CM | POA: Diagnosis present

## 2017-11-14 DIAGNOSIS — R45851 Suicidal ideations: Secondary | ICD-10-CM | POA: Diagnosis present

## 2017-11-14 DIAGNOSIS — F332 Major depressive disorder, recurrent severe without psychotic features: Principal | ICD-10-CM | POA: Diagnosis present

## 2017-11-14 DIAGNOSIS — F410 Panic disorder [episodic paroxysmal anxiety] without agoraphobia: Secondary | ICD-10-CM

## 2017-11-14 DIAGNOSIS — F329 Major depressive disorder, single episode, unspecified: Secondary | ICD-10-CM | POA: Diagnosis not present

## 2017-11-14 DIAGNOSIS — Z6379 Other stressful life events affecting family and household: Secondary | ICD-10-CM

## 2017-11-14 DIAGNOSIS — F988 Other specified behavioral and emotional disorders with onset usually occurring in childhood and adolescence: Secondary | ICD-10-CM | POA: Diagnosis present

## 2017-11-14 DIAGNOSIS — R450 Nervousness: Secondary | ICD-10-CM

## 2017-11-14 DIAGNOSIS — F1429 Cocaine dependence with unspecified cocaine-induced disorder: Secondary | ICD-10-CM | POA: Insufficient documentation

## 2017-11-14 DIAGNOSIS — F1129 Opioid dependence with unspecified opioid-induced disorder: Secondary | ICD-10-CM | POA: Insufficient documentation

## 2017-11-14 DIAGNOSIS — Z590 Homelessness: Secondary | ICD-10-CM | POA: Insufficient documentation

## 2017-11-14 DIAGNOSIS — F1410 Cocaine abuse, uncomplicated: Secondary | ICD-10-CM | POA: Diagnosis not present

## 2017-11-14 LAB — COMPREHENSIVE METABOLIC PANEL
ALK PHOS: 94 U/L (ref 38–126)
ALT: 19 U/L (ref 14–54)
AST: 22 U/L (ref 15–41)
Albumin: 3.6 g/dL (ref 3.5–5.0)
Anion gap: 11 (ref 5–15)
BUN: 15 mg/dL (ref 6–20)
CALCIUM: 9.1 mg/dL (ref 8.9–10.3)
CHLORIDE: 105 mmol/L (ref 101–111)
CO2: 23 mmol/L (ref 22–32)
CREATININE: 0.85 mg/dL (ref 0.44–1.00)
GFR calc Af Amer: >60 mL/min (ref >60)
Glucose, Bld: 128 mg/dL — ABNORMAL HIGH (ref 65–99)
Potassium: 4 mmol/L (ref 3.5–5.1)
Sodium: 139 mmol/L (ref 135–145)
Total Bilirubin: 1.2 mg/dL (ref 0.3–1.2)
Total Protein: 7 g/dL (ref 6.5–8.1)

## 2017-11-14 LAB — I-STAT CHEM 8, ED
BUN: 15 mg/dL (ref 6–20)
CALCIUM ION: 1.18 mmol/L (ref 1.15–1.40)
Chloride: 103 mmol/L (ref 101–111)
Creatinine, Ser: 0.9 mg/dL (ref 0.44–1.00)
GLUCOSE: 96 mg/dL (ref 65–99)
HCT: 38 % (ref 36.0–46.0)
HEMOGLOBIN: 12.9 g/dL (ref 12.0–15.0)
Potassium: 3.7 mmol/L (ref 3.5–5.1)
SODIUM: 142 mmol/L (ref 135–145)
TCO2: 30 mmol/L (ref 22–32)

## 2017-11-14 LAB — CBC
HCT: 33.3 % — ABNORMAL LOW (ref 36.0–46.0)
HEMOGLOBIN: 8.6 g/dL — AB (ref 12.0–15.0)
MCH: 23.8 pg — ABNORMAL LOW (ref 26.0–34.0)
MCHC: 25.8 g/dL — ABNORMAL LOW (ref 30.0–36.0)
MCV: 92 fL (ref 78.0–100.0)
Platelets: 178 10*3/uL (ref 150–400)
RBC: 3.62 MIL/uL — AB (ref 3.87–5.11)
RDW: 13.8 % (ref 11.5–15.5)
WBC: 7 10*3/uL (ref 4.0–10.5)

## 2017-11-14 LAB — RAPID URINE DRUG SCREEN, HOSP PERFORMED
AMPHETAMINES: POSITIVE — AB
Barbiturates: NOT DETECTED
Benzodiazepines: NOT DETECTED
Cocaine: POSITIVE — AB
Opiates: POSITIVE — AB
TETRAHYDROCANNABINOL: NOT DETECTED

## 2017-11-14 LAB — I-STAT BETA HCG BLOOD, ED (MC, WL, AP ONLY)

## 2017-11-14 LAB — ETHANOL

## 2017-11-14 LAB — ACETAMINOPHEN LEVEL: Acetaminophen (Tylenol), Serum: <10 ug/mL — ABNORMAL LOW (ref 10–30)

## 2017-11-14 LAB — SALICYLATE LEVEL: Salicylate Lvl: <7 mg/dL (ref 2.8–30.0)

## 2017-11-14 MED ORDER — NAPROXEN 500 MG PO TABS
500.0000 mg | ORAL_TABLET | Freq: Two times a day (BID) | ORAL | Status: DC | PRN
  Administered 2017-11-14 – 2017-11-15 (×2): 500 mg via ORAL
  Filled 2017-11-14 (×2): qty 1

## 2017-11-14 MED ORDER — HYDROXYZINE HCL 25 MG PO TABS
25.0000 mg | ORAL_TABLET | Freq: Four times a day (QID) | ORAL | Status: DC | PRN
  Administered 2017-11-14 – 2017-11-15 (×2): 25 mg via ORAL
  Filled 2017-11-14 (×2): qty 1
  Filled 2017-11-14: qty 10

## 2017-11-14 MED ORDER — ONDANSETRON 4 MG PO TBDP
4.0000 mg | ORAL_TABLET | Freq: Four times a day (QID) | ORAL | Status: DC | PRN
  Administered 2017-11-14: 4 mg via ORAL
  Filled 2017-11-14: qty 1

## 2017-11-14 MED ORDER — MAGNESIUM HYDROXIDE 400 MG/5ML PO SUSP: 30.0000 mL | Freq: Every day | ORAL | Status: DC | PRN

## 2017-11-14 MED ORDER — METHOCARBAMOL 500 MG PO TABS
500.0000 mg | ORAL_TABLET | Freq: Three times a day (TID) | ORAL | Status: DC | PRN
  Administered 2017-11-14: 500 mg via ORAL
  Filled 2017-11-14: qty 1

## 2017-11-14 MED ORDER — TRAZODONE HCL 100 MG PO TABS
100.0000 mg | ORAL_TABLET | Freq: Every day | ORAL | Status: DC
  Administered 2017-11-14 – 2017-11-15 (×2): 100 mg via ORAL
  Filled 2017-11-14 (×3): qty 1

## 2017-11-14 MED ORDER — ALUM & MAG HYDROXIDE-SIMETH 200-200-20 MG/5ML PO SUSP: 30.0000 mL | ORAL | Status: DC | PRN

## 2017-11-14 MED ORDER — ENSURE ENLIVE PO LIQD
237.0000 mL | Freq: Two times a day (BID) | ORAL | Status: DC
  Administered 2017-11-14 – 2017-11-16 (×4): 237 mL via ORAL

## 2017-11-14 MED ORDER — NICOTINE 21 MG/24HR TD PT24
21.0000 mg | MEDICATED_PATCH | Freq: Every day | TRANSDERMAL | Status: DC
  Administered 2017-11-14 – 2017-11-16 (×2): 21 mg via TRANSDERMAL
  Filled 2017-11-14 (×6): qty 1

## 2017-11-14 MED ORDER — DICYCLOMINE HCL 20 MG PO TABS
20.0000 mg | ORAL_TABLET | Freq: Four times a day (QID) | ORAL | Status: DC | PRN
  Administered 2017-11-14 – 2017-11-15 (×2): 20 mg via ORAL
  Filled 2017-11-14 (×2): qty 1

## 2017-11-14 MED ORDER — LOPERAMIDE HCL 2 MG PO CAPS: 2.0000 mg | ORAL_CAPSULE | ORAL | Status: DC | PRN

## 2017-11-14 MED ORDER — CLONIDINE HCL 0.1 MG PO TABS
0.1000 mg | ORAL_TABLET | ORAL | Status: DC
  Filled 2017-11-14 (×2): qty 1

## 2017-11-14 MED ORDER — CLONIDINE HCL 0.1 MG PO TABS: 0.1000 mg | ORAL_TABLET | Freq: Every day | ORAL | Status: DC

## 2017-11-14 MED ORDER — CLONIDINE HCL 0.1 MG PO TABS
0.1000 mg | ORAL_TABLET | Freq: Four times a day (QID) | ORAL | Status: DC
  Administered 2017-11-14 – 2017-11-16 (×6): 0.1 mg via ORAL
  Filled 2017-11-14 (×11): qty 1

## 2017-11-14 NOTE — ED Notes (Signed)
Bed: [facility] Expected date:  Expected time:  Means of arrival:  Comments: 

## 2017-11-14 NOTE — ED Provider Notes (Signed)
[facility]-EMERGENCY DEPT Provider Note   CSN: [id number] Arrival date & time: 11/14/17  0257     History   Chief Complaint Chief Complaint  Patient presents with  . Medical Clearance  . Suicidal    HPI Marissa Beasley is a 37 y.o. female.  Patient presents to the emergency department with a chief complaint of suicidal ideation.  She is homeless and a heroin addict.  She states that she does not want to live like this anymore.  She states that she wants to kill herself by overdose of heroin.  Her last use was last night at 8 PM.  She reports having no desire to live.  She denies any other symptoms.  Denies chest pain, shortness of breath, lightheadedness, dizziness, or fatigue.  She denies any auditory or visual hallucinations.  The history is provided by the patient. No language interpreter was used.    Past Medical History:  Diagnosis Date  . ADD (attention deficit disorder)   . Bipolar disorder (HCC)   . Depression   . Gestational diabetes   . Heroin addiction [facility])     Patient Active Problem List   Diagnosis Date Noted  . BACTERIAL VAGINITIS 01/04/2010  . VAGINAL DISCHARGE 01/04/2010    Past Surgical History:  Procedure Laterality Date  . CHOLECYSTECTOMY  2011  . TUBAL LIGATION  2010     OB History   None      Home Medications    Prior to Admission medications   Medication Sig Start Date End Date Taking? Authorizing Provider  citalopram (CELEXA) 40 MG tablet Take 40 mg by mouth daily.    [provider]  lamoTRIgine (LAMICTAL) 100 MG tablet Take 100 mg by mouth daily.     [provider]  traZODone (DESYREL) 100 MG tablet Take 100 mg by mouth at bedtime.     [provider]    Family History Family History  Problem Relation Age of Onset  . Diabetes Other     Social History Social History   Tobacco Use  . Smoking status: Current Every Day Smoker    Packs/day: 2.00    Types: Cigarettes  .  Smokeless tobacco: Never Used  Substance Use Topics  . Alcohol use: No  . Drug use: Yes    Frequency: 7.0 times per week    Types: Heroin    Comment: heroin     Allergies   Tylenol [acetaminophen]   Review of Systems Review of Systems  All other systems reviewed and are negative.    Physical Exam Updated Vital Signs BP (!) 99/58 (BP Location: Left Arm)   Pulse 86   Temp 98 F (36.7 C) (Oral)   Resp 18   Ht 5\' 7"  (1.702 m)   Wt 63.5 kg (140 lb)   LMP 11/12/2017   SpO2 98%   BMI 21.93 kg/m   Physical Exam  Constitutional: She is oriented to person, place, and time. She appears well-developed and well-nourished.  HENT:  Head: Normocephalic and atraumatic.  Eyes: Pupils are equal, round, and reactive to light. Conjunctivae and EOM are normal.  Neck: Normal range of motion. Neck supple.  Cardiovascular: Normal rate and regular rhythm. Exam reveals no gallop and no friction rub.  No murmur heard. Pulmonary/Chest: Effort normal and breath sounds normal. No respiratory distress. She has no wheezes. She has no rales. She exhibits no tenderness.  Abdominal: Soft. Bowel sounds are normal. She exhibits no distension and no mass.  There is no tenderness. There is no rebound and no guarding.  Musculoskeletal: Normal range of motion. She exhibits no edema or tenderness.  Neurological: She is alert and oriented to person, place, and time.  Skin: Skin is warm and dry.  Psychiatric: She has a normal mood and affect. Her behavior is normal. Judgment and thought content normal.  Nursing note and vitals reviewed.    ED Treatments / Results  Labs (all labs ordered are listed, but only abnormal results are displayed) Labs Reviewed  ACETAMINOPHEN LEVEL - Abnormal; Notable for the following components:      Result Value   Acetaminophen (Tylenol), Serum <10 (*)    All other components within normal limits  CBC - Abnormal; Notable for the following components:   RBC 3.62 (*)     Hemoglobin 8.6 (*)    HCT 33.3 (*)    MCH 23.8 (*)    MCHC 25.8 (*)    All other components within normal limits  ETHANOL  SALICYLATE LEVEL  COMPREHENSIVE METABOLIC PANEL  RAPID URINE DRUG SCREEN, HOSP PERFORMED  CBC  I-STAT BETA HCG BLOOD, ED ([facility], [facility], [facility] ONLY)    EKG None  Radiology No results found.  Procedures Procedures (including critical care time)  Medications Ordered in ED Medications - No data to display   Initial Impression / Assessment and Plan / ED Course  I have reviewed the triage vital signs and the nursing notes.  Pertinent labs & imaging results that were available during my care of the patient were reviewed by me and considered in my medical decision making (see chart for details).     Patient with suicidal ideations.  She reports wanting to kill herself by overdose.  Operatory workup is notable for hemoglobin of 8.6, most recent comparison was 6 years ago showing hemoglobin of about 15.  Patient states that she loses blood when she injects heroin, but denies any blood in her stools or heavy menstrual periods.  She denies shortness of breath, lightheadedness, or dizziness.  I believe that the hemoglobin is not new, and she is likely been gradually declining over time.  Will check anemia panel to assist with future outpatient workup.  She is in no acute distress.    She appears stable and is medically clear.    RN concerned about lab error for HGB duet to limited amount of blood drawn.  I was asked to assist with lab draw.  Repeat HGB is normal.    Patient is medically clear.  [facility].  Final Clinical Impressions(s) / ED Diagnoses   Final diagnoses:  Suicidal ideation    ED Discharge Orders    None       Roxy HorsemanBrowning, Jacque Byron, PA-C 11/14/17 0544    Molpus, Jonny RuizJohn, MD 11/14/17 [phone]

## 2017-11-14 NOTE — ED Notes (Signed)
This Clinical research associatewriter attempt to collect labs. Not enough blood inside tube for testing

## 2017-11-14 NOTE — [facility] Suicide Risk Assessment (Addendum)
[facility] Admission Suicide Risk Assessment   Nursing information obtained from:    Demographic factors:    Current Mental Status:    Loss Factors:    Historical Factors:    Risk Reduction Factors:     Total Time spent with patient: 20 minutes Principal Problem: <principal problem not specified> Diagnosis:   Patient Active Problem List   Diagnosis Date Noted  . Severe recurrent major depression without psychotic features (HCC) [F33.2] 11/14/2017  . BACTERIAL VAGINITIS [N76.0] 01/04/2010  . VAGINAL DISCHARGE [N89.8] 01/04/2010   Subjective Data: Patient is seen and examined.  Patient is a 37 year old female with a past psychiatric history significant for opiate dependence as well as cocaine use disorder.  She presented to the [facility] emergency department yesterday with suicidal ideation.  She is homeless and a heroin addict.  She stated she did not want to live like this anymore.  She stated she want to kill herself by overdose of heroin.  On interview this morning she stated she wants to get into detox and then to go to a rehabilitation facility.  She stated she is been in detox 4-5 times, and has been in rehabilitation 2-3 times.  She stated the last rehabilitation stay was proximally 4-5 years ago.  She stated she uses crack cocaine as well as heroin.  She stated her heroin in take was 1 g basically a day.  She uses it IV.  She denied alcohol or any other illicit substance.  She was admitted to the hospital for evaluation and stabilization.  Continued Clinical Symptoms:    The "Alcohol Use Disorders Identification Test", Guidelines for Use in Primary Care, Second Edition.  World Science writer [facility]). Score between 0-7:  no or low risk or alcohol related problems. Score between 8-15:  moderate risk of alcohol related problems. Score between 16-19:  high risk of alcohol related problems. Score 20 or above:  warrants further diagnostic evaluation for alcohol dependence and  treatment.   CLINICAL FACTORS:   Alcohol/Substance Abuse/Dependencies   Musculoskeletal: Strength & Muscle Tone: within normal limits Gait & Station: normal Patient leans: N/A  Psychiatric Specialty Exam: Physical Exam  Nursing note and vitals reviewed. Constitutional: She is oriented to person, place, and time. She appears well-developed and well-nourished.  HENT:  Head: Normocephalic and atraumatic.  Respiratory: Effort normal.  Musculoskeletal: Normal range of motion.  Neurological: She is alert and oriented to person, place, and time.    ROS  Blood pressure 122/90, pulse 85, temperature 98.6 F (37 C), temperature source Oral, height 5\' 7"  (1.702 m), weight 62.6 kg (138 lb), last menstrual period 11/12/2017, SpO2 98 %.Body mass index is 21.61 kg/m.  General Appearance: Disheveled  Eye Contact:  Minimal  Speech:  Slow  Volume:  Decreased  Mood:  Irritable  Affect:  Congruent  Thought Process:  Coherent  Orientation:  Full (Time, Place, and Person)  Thought Content:  Logical  Suicidal Thoughts:  Yes.  without intent/plan  Homicidal Thoughts:  No  Memory:  Immediate;   Poor  Judgement:  Impaired  Insight:  Lacking  Psychomotor Activity:  Psychomotor Retardation  Concentration:  Concentration: Poor  Recall:  Poor  Fund of Knowledge:  Poor  Language:  Fair  Akathisia:  No  Handed:  Right  AIMS (if indicated):     Assets:  Leisure Time  ADL's:  Intact  Cognition:  WNL  Sleep:         COGNITIVE FEATURES THAT CONTRIBUTE TO RISK:  None  SUICIDE RISK:   Mild:  Suicidal ideation of limited frequency, intensity, duration, and specificity.  There are no identifiable plans, no associated intent, mild dysphoria and related symptoms, good self-control (both objective and subjective assessment), few other risk factors, and identifiable protective factors, including available and accessible social support.  PLAN OF CARE: Patient is seen and examined.  Patient is a  37 year old female with a past psychiatric history significant for opiate dependence as well as cocaine use disorder.  She presented with suicidal ideation to the emergency department and was admitted here for evaluation and stabilization.  She is requesting detox and rehabilitation stay.  She appears to lack motivation for that at this time.  She will be admitted to the hospital and integrated into the milieu.  She will go to groups for coping skills as well as substance abuse treatment.  She will be seen individually by social work to assess capability of getting into a rehabilitation facility.  She will be monitored every 15 minutes for withdrawal syndromes.  She was offered lab tests for HIV as well as hepatitis but declined both of these.  I certify that inpatient services furnished can reasonably be expected to improve the patient's condition.   Antonieta PertGreg Lawson Clary, MD 11/14/2017, 11:41 AM

## 2017-11-14 NOTE — H&P (Signed)
Behavioral Health Medical Screening Exam  Marissa Beasley is an 37 y.o. female.  Total Time spent with patient: 20 minutes  Psychiatric Specialty Exam: Physical Exam  Constitutional: She is oriented to person, place, and time. She appears well-developed and well-nourished. No distress.  HENT:  Head: Normocephalic and atraumatic.  Right Ear: External ear normal.  Left Ear: External ear normal.  Eyes: Conjunctivae are normal. Right eye exhibits no discharge. Left eye exhibits no discharge. No scleral icterus.  Cardiovascular: Normal rate.  Respiratory: Effort normal. No respiratory distress.  Musculoskeletal: Normal range of motion.  Neurological: She is alert and oriented to person, place, and time.  Skin: Skin is warm and dry. She is not diaphoretic.  Psychiatric: Her speech is normal. Her mood appears anxious. Her affect is blunt. She is not withdrawn and not actively hallucinating. Thought content is not paranoid and not delusional. Cognition and memory are normal. She expresses impulsivity and inappropriate judgment. She exhibits a depressed mood. She expresses suicidal ideation. She expresses no homicidal ideation. She expresses suicidal plans.    Review of Systems  Constitutional: Negative for chills, fever and weight loss.  Psychiatric/Behavioral: Positive for depression, substance abuse and suicidal ideas. Negative for hallucinations and memory loss. The patient is nervous/anxious and has insomnia.   All other systems reviewed and are negative.   Blood pressure (!) 99/54, pulse 82, temperature 98.4 F (36.9 C), resp. rate 16, SpO2 97 %.There is no height or weight on file to calculate BMI.  General Appearance: Disheveled  Eye Contact:  Fair  Speech:  Clear and Coherent and Normal Rate  Volume:  Decreased  Mood:  Anxious, Depressed, Dysphoric, Hopeless and Worthless  Affect:  Blunt  Thought Process:  Coherent, Goal Directed and Descriptions of Associations: Intact   Orientation:  Full (Time, Place, and Person)  Thought Content:  Logical and Hallucinations: None  Suicidal Thoughts:  Yes.  with intent/plan  Homicidal Thoughts:  No  Memory:  Immediate;   Good Recent;   Good  Judgement:  Impaired  Insight:  Lacking  Psychomotor Activity:  Restlessness  Concentration: Concentration: Fair and Attention Span: Fair  Recall:  Good  Fund of Knowledge:Good  Language: Good  Akathisia:  No  Handed:  Right  AIMS (if indicated):     Assets:  Communication Skills Desire for Improvement Leisure Time  Sleep:       Musculoskeletal: Strength & Muscle Tone: within normal limits Gait & Station: normal   Blood pressure (!) 99/54, pulse 82, temperature 98.4 F (36.9 C), resp. rate 16, SpO2 97 %.  Recommendations:  Based on my evaluation the patient does not appear to have an emergency medical condition.  Jackelyn PolingJason A Taryll Reichenberger, NP 11/14/2017, 2:14 AM

## 2017-11-14 NOTE — [facility] Counselor (Signed)
Pt was assessed as a walk-in and [facility] and assigned to 301-1. Nursing report: [phone]. Discussed updated disposition with Rob, PA and ProspectJeneen, Charity fundraiserN.    Redmond Pullingreylese D Elaf Clauson, MS, [facility], [facility] Triage Specialist [phone]

## 2017-11-14 NOTE — Progress Notes (Signed)
Patient is a 37 yr old female,[facility] reporting that she had plans to overdose on heroin. Pt has a hx of Bipolar, MDD and ADD. Upon admission assessment, pt presents with anxious affect and mood- fidgety behavior- poor eye contact- requesting for interview to 'hurry', stating, "I just want to go lay down. I hardly slept last night".  Pt is alert and oriented, denies pain and SI/HI and A/V hallucinations. Pt reports having used heroin yesterday and is withdrawing. Pt reports using heroin and cocaine daily. Pt's only complaint at this time is "the sweats". VS obtained and were stable. Admission paperwork completed and signed. Verbal understanding expressed. Belongings searched and secured in locker. Skin assessment performed, revealing no contraband nor skin abnormalities, other than multiple tattoos over body. Pt oriented to unit. Q 15 min checks initiated for safety. Pt brought pitcher of Gatorade and encouraged to drink fluids. Pt resting with eyes closed in bed- appears to be sleeping at this time.

## 2017-11-14 NOTE — ED Notes (Signed)
Pt arrived to unit changed out and with urin specimen, 1 pt belongings bag and 1 pink backpack placed in locker 27. Pt provided Malawiturkey sandwich and apple juice.

## 2017-11-14 NOTE — Tx Team (Signed)
Initial Treatment Plan 11/14/2017 12:28 PM Marissa Beasley ZOX:[id number]    PATIENT STRESSORS: Financial difficulties Medication change or noncompliance Substance abuse Other: homeless   PATIENT STRENGTHS: Average or above average intelligence Capable of independent living Communication skills Physical Health   PATIENT IDENTIFIED PROBLEMS:   "I just want to detox"    homelessness               DISCHARGE CRITERIA:  Ability to meet basic life and health needs Adequate post-discharge living arrangements Improved stabilization in mood, thinking, and/or behavior Motivation to continue treatment in a less acute level of care Reduction of life-threatening or endangering symptoms to within safe limits Verbal commitment to aftercare and medication compliance  PRELIMINARY DISCHARGE PLAN: Attend aftercare/continuing care group Outpatient therapy Placement in alternative living arrangements  PATIENT/FAMILY INVOLVEMENT: This treatment plan has been presented to and reviewed with the patient, Marissa Beasley..The patient has been given the opportunity to ask questions and make suggestions.  Shela NevinValerie S Devlin Brink, RN 11/14/2017, 12:28 PM

## 2017-11-14 NOTE — H&P (Signed)
Psychiatric Admission Assessment Adult  Patient Identification: Marissa Beasley MRN:  [id number] Date of Evaluation:  11/14/2017 Chief Complaint:  bipolar disorder Principal Diagnosis: Severe recurrent major depression without psychotic features [facility]) Diagnosis:   Patient Active Problem List   Diagnosis Date Noted  . Severe recurrent major depression without psychotic features ([place]) [F33.2] 11/14/2017  . Opiate dependence, continuous ([place]) [F11.20] 11/14/2017  . Cocaine abuse ([place]) [F14.10] 11/14/2017  . Amphetamine abuse ([place]) [F15.10] 11/14/2017  . BACTERIAL VAGINITIS [N76.0] 01/04/2010  . VAGINAL DISCHARGE [N89.8] 01/04/2010   History of Present Illness:  11/14/17 [facility] Counselor Assessment: 37 y.o. female who presents to [facility] as a walk-in voluntarily. Pt states she is currently suicidal and has plans to OD on heroin. Pt states she has been depressed and thinking about suicide for the past several months. Pt identifies her stressors as losing her job in August 2018 causing her to relapse on heroin after being clean for 4 years, not seeing her children "in forever", being homeless, and having no support. Pt states to this writer "I would rather die than continue to live the way I've been doing. If I go back out there I am going to kill myself." Pt also has a hx of self-inflicted wounds. Pt pulled up her sleeves during the assessment and showed this writer cuts she has been making on her arms.  Pt denies HI and denies AVH at present. Pt states she uses heroin and cocaine everyday. Pt has a hx of inpt and OPT treatment and has also been to several substance abuse treatment facilities including [facility], [facility], and [facility].  Pt appeared anxious throughout the assessment asking this writer "how long is this going to take? I'm tired. I want to go to sleep." Pt was informed that she would be sent to [facility] for medical clearance and pt stated if she goes to [facility], she is going to "walk out." Pt states she wants  to be in a locked facility where she cannot get out. Pt stated "if I leave, I'm going to kill myself." Pt then asked this writer if she would be able to smoke before she gets admitted and when she was advised that she could not smoke but could be given nicotine patches.  11/14/17 [place] MD SRA Assessment: Patient is seen and examined.  Patient is a 37 year old female with a past psychiatric history significant for opiate dependence as well as cocaine use disorder.  She presented with suicidal ideation to the emergency department and was admitted here for evaluation and stabilization.  She is requesting detox and rehabilitation stay.  She appears to lack motivation for that at this time.  She will be admitted to the hospital and integrated into the milieu.  She will go to groups for coping skills as well as substance abuse treatment.  She will be seen individually by social work to assess capability of getting into a rehabilitation facility.  She will be monitored every 15 minutes for withdrawal syndromes.  On Evaluation today: Patient walks past me as I try to speak to her. She refuses to answer questions. "I'm going to bed. I didn't talk about medications with the other doctor. They kept me up all night at the hospital." She returns to her room and closes her door. I attempt to ask her more questions but she just lies in bed and refuses to answer.   Associated Signs/Symptoms: Depression Symptoms:  depressed mood, anhedonia, fatigue, feelings of worthlessness/guilt, hopelessness, suicidal thoughts with specific plan, anxiety,  loss of energy/fatigue, disturbed sleep, (Hypo) Manic Symptoms:  Irritable Mood, Anxiety Symptoms:  Excessive Worry, Panic Symptoms, Psychotic Symptoms:  Denies PTSD Symptoms: NA Total Time spent with patient: 15 minutes  Past Psychiatric History: MDD, Opiate dependence, cocaine abuse, amphetamine abuse  Is the patient at risk to self? Yes.    Has the patient been a  risk to self in the past 6 months? No.  Has the patient been a risk to self within the distant past? No.  Is the patient a risk to others? No.  Has the patient been a risk to others in the past 6 months? No.  Has the patient been a risk to others within the distant past? No.   Prior Inpatient Therapy:   Prior Outpatient Therapy:    Alcohol Screening: Patient refused Alcohol Screening Tool: Yes 1. How often do you have a drink containing alcohol?: Never 2. How many drinks containing alcohol do you have on a typical day when you are drinking?: 1 or 2 3. How often do you have six or more drinks on one occasion?: Never AUDIT-C Score: 0 4. How often during the last year have you found that you were not able to stop drinking once you had started?: Never 5. How often during the last year have you failed to do what was normally expected from you becasue of drinking?: Never 6. How often during the last year have you needed a first drink in the morning to get yourself going after a heavy drinking session?: Never 7. How often during the last year have you had a feeling of guilt of remorse after drinking?: Never 8. How often during the last year have you been unable to remember what happened the night before because you had been drinking?: Never 9. Have you or someone else been injured as a result of your drinking?: No 10. Has a relative or friend or a doctor or another health worker been concerned about your drinking or suggested you cut down?: No Alcohol Use Disorder Identification Test Final Score (AUDIT): 0 Intervention/Follow-up: AUDIT Score <7 follow-up not indicated Substance Abuse History in the last 12 months:  Yes.   Consequences of Substance Abuse: Medical Consequences:  reviewed Legal Consequences:  reviewed Family Consequences:  reviewed Previous Psychotropic Medications: Yes  Psychological Evaluations: Yes  Past Medical History:  Past Medical History:  Diagnosis Date  . ADD  (attention deficit disorder)   . Bipolar disorder ([place])   . Depression   . Gestational diabetes   . Heroin addiction [facility])     Past Surgical History:  Procedure Laterality Date  . CHOLECYSTECTOMY  2011  . TUBAL LIGATION  2010   Family History:  Family History  Problem Relation Age of Onset  . Diabetes Other    Family Psychiatric  History: Refuses to answer Tobacco Screening:   Social History:  Social History   Substance and Sexual Activity  Alcohol Use No     Social History   Substance and Sexual Activity  Drug Use Yes  . Frequency: 7.0 times per week  . Types: Heroin   Comment: heroin    Additional Social History:                           Allergies:   Allergies  Allergen Reactions  . Tylenol [Acetaminophen] Rash   Lab Results:  Results for orders placed or performed during the hospital encounter of 11/14/17 (from the past 48 hour(s))  Comprehensive metabolic panel     Status: Abnormal   Collection Time: 11/14/17  3:37 AM  Result Value Ref Range   Sodium 139 135 - 145 mmol/L   Potassium 4.0 3.5 - 5.1 mmol/L   Chloride 105 101 - 111 mmol/L   CO2 23 22 - 32 mmol/L   Glucose, Bld 128 (H) 65 - 99 mg/dL   BUN 15 6 - 20 mg/dL   Creatinine, Ser 0.85 0.44 - 1.00 mg/dL   Calcium 9.1 8.9 - 10.3 mg/dL   Total Protein 7.0 6.5 - 8.1 g/dL   Albumin 3.6 3.5 - 5.0 g/dL   AST 22 15 - 41 U/L   ALT 19 14 - 54 U/L   Alkaline Phosphatase 94 38 - 126 U/L   Total Bilirubin 1.2 0.3 - 1.2 mg/dL   GFR calc non Af Amer >60 >60 mL/min   GFR calc Af Amer >60 >60 mL/min    Comment: (NOTE) The eGFR has been calculated using the CKD EPI equation. This calculation has not been validated in all clinical situations. eGFR's persistently <60 mL/min signify possible Chronic Kidney Disease.    Anion gap 11 5 - 15    Comment: Performed at [facility], [place] [address] [place], [place] [address]  Ethanol     Status: None   Collection Time: 11/14/17  3:37 AM   Result Value Ref Range   Alcohol, Ethyl (B) <10 <10 mg/dL    Comment:        LOWEST DETECTABLE LIMIT FOR SERUM ALCOHOL IS 10 mg/dL FOR MEDICAL PURPOSES ONLY Performed at [place] [address], [place], [place] [address]   Salicylate level     Status: None   Collection Time: 11/14/17  3:37 AM  Result Value Ref Range   Salicylate Lvl <3.4 2.8 - 30.0 mg/dL    Comment: Performed at [facility], [place] [address] [place], [place] [address]  Acetaminophen level     Status: Abnormal   Collection Time: 11/14/17  3:37 AM  Result Value Ref Range   Acetaminophen (Tylenol), Serum <10 (L) 10 - 30 ug/mL    Comment:        THERAPEUTIC CONCENTRATIONS VARY SIGNIFICANTLY. A RANGE OF 10-30 ug/mL MAY BE AN EFFECTIVE CONCENTRATION FOR MANY PATIENTS. HOWEVER, SOME ARE BEST TREATED AT CONCENTRATIONS OUTSIDE THIS RANGE. ACETAMINOPHEN CONCENTRATIONS >150 ug/mL AT 4 HOURS AFTER INGESTION AND >50 ug/mL AT 12 HOURS AFTER INGESTION ARE OFTEN ASSOCIATED WITH TOXIC REACTIONS. Performed at [facility], [place] [address] [place], [place] [address]   cbc     Status: Abnormal   Collection Time: 11/14/17  3:37 AM  Result Value Ref Range   WBC 7.0 4.0 - 10.5 K/uL   RBC 3.62 (L) 3.87 - 5.11 MIL/uL   Hemoglobin 8.6 (L) 12.0 - 15.0 g/dL   HCT 33.3 (L) 36.0 - 46.0 %   MCV 92.0 78.0 - 100.0 fL   MCH 23.8 (L) 26.0 - 34.0 pg   MCHC 25.8 (L) 30.0 - 36.0 g/dL   RDW 13.8 11.5 - 15.5 %   Platelets 178 150 - 400 K/uL    Comment: Performed at [facility], [place] [address] [place], [place] [address]  I-Stat beta hCG blood, ED     Status: None   Collection Time: 11/14/17  3:42 AM  Result Value Ref Range   I-stat hCG, quantitative <5.0 <5 mIU/mL   Comment 3  Comment:   GEST. AGE      CONC.  (mIU/mL)   <=1 WEEK        5 - 50     2 WEEKS       50 - 500     3 WEEKS       100 - 10,000     4 WEEKS     1,000 - 30,000         FEMALE AND NON-PREGNANT FEMALE:     LESS THAN 5 mIU/mL   I-stat chem 8, ed     Status: None   Collection Time: 11/14/17  5:36 AM  Result Value Ref Range   Sodium 142 135 - 145 mmol/L   Potassium 3.7 3.5 - 5.1 mmol/L   Chloride 103 101 - 111 mmol/L   BUN 15 6 - 20 mg/dL   Creatinine, Ser 0.90 0.44 - 1.00 mg/dL   Glucose, Bld 96 65 - 99 mg/dL   Calcium, Ion 1.18 1.15 - 1.40 mmol/L   TCO2 30 22 - 32 mmol/L   Hemoglobin 12.9 12.0 - 15.0 g/dL   HCT 38.0 36.0 - 46.0 %  Rapid urine drug screen (hospital performed)     Status: Abnormal   Collection Time: 11/14/17  6:28 AM  Result Value Ref Range   Opiates POSITIVE (A) NONE DETECTED   Cocaine POSITIVE (A) NONE DETECTED   Benzodiazepines NONE DETECTED NONE DETECTED   Amphetamines POSITIVE (A) NONE DETECTED   Tetrahydrocannabinol NONE DETECTED NONE DETECTED   Barbiturates NONE DETECTED NONE DETECTED    Comment: (NOTE) DRUG SCREEN FOR MEDICAL PURPOSES ONLY.  IF CONFIRMATION IS NEEDED FOR ANY PURPOSE, NOTIFY LAB WITHIN 5 DAYS. LOWEST DETECTABLE LIMITS FOR URINE DRUG SCREEN Drug Class                     Cutoff (ng/mL) Amphetamine and metabolites    1000 Barbiturate and metabolites    200 Benzodiazepine                 614 Tricyclics and metabolites     300 Opiates and metabolites        300 Cocaine and metabolites        300 THC                            50 Performed at [facility], [place] [address] [place], [place] [address]     Blood Alcohol level:  Lab Results  Component Value Date   [facility] <10 11/14/2017   ETH <11 00/86/7619    Metabolic Disorder Labs:  No results found for: HGBA1C, MPG No results found for: PROLACTIN No results found for: CHOL, TRIG, HDL, CHOLHDL, VLDL, LDLCALC  Current Medications: Current Facility-Administered Medications  Medication Dose Route Frequency Provider Last Rate Last Dose  . alum & mag hydroxide-simeth (MAALOX/MYLANTA) 200-200-20 MG/5ML suspension 30 mL  30 mL Oral Q4H  PRN Lindon Romp A, NP      . feeding supplement (ENSURE ENLIVE) (ENSURE ENLIVE) liquid 237 mL  237 mL Oral BID BM Money, Darnelle Maffucci B, FNP      . magnesium hydroxide (MILK OF MAGNESIA) suspension 30 mL  30 mL Oral Daily PRN Lindon Romp A, NP      . nicotine (NICODERM CQ - dosed in mg/24 hours) patch 21 mg  21 mg Transdermal Daily Money, Lowry Ram, FNP      . traZODone (DESYREL) tablet  100 mg  100 mg Oral QHS Lindon Romp A, NP       PTA Medications: No medications prior to admission.    Musculoskeletal: Strength & Muscle Tone: within normal limits Gait & Station: normal Patient leans: N/A  Psychiatric Specialty Exam: Physical Exam  Nursing note and vitals reviewed. Constitutional: She is oriented to person, place, and time. She appears well-developed and well-nourished.  Cardiovascular: Normal rate.  Respiratory: Effort normal.  Musculoskeletal: Normal range of motion.  Neurological: She is alert and oriented to person, place, and time.  Skin: Skin is warm.    Review of Systems  Constitutional: Negative.   HENT: Negative.   Eyes: Negative.   Respiratory: Negative.   Cardiovascular: Negative.   Gastrointestinal: Negative.   Genitourinary: Negative.   Musculoskeletal: Negative.   Skin: Negative.   Neurological: Negative.   Endo/Heme/Allergies: Negative.   Psychiatric/Behavioral: Positive for depression, substance abuse and suicidal ideas. The patient is nervous/anxious.     Blood pressure 109/66, pulse 75, temperature 98.6 F (37 C), temperature source Oral, resp. rate 16, height 5' 7" (1.702 m), weight 62.6 kg (138 lb), last menstrual period 11/12/2017, SpO2 98 %.Body mass index is 21.61 kg/m.  General Appearance: Casual  Eye Contact:  Fair  Speech:  Clear and Coherent and Normal Rate  Volume:  Normal  Mood:  Irritable  Affect:  Flat  Thought Process:  Goal Directed and Descriptions of Associations: Intact  Orientation:  Full (Time, Place, and Person)  Thought Content:   WDL  Suicidal Thoughts:  Yes.  without intent/plan  Homicidal Thoughts:  No  Memory:  Immediate;   Good Recent;   Good Remote;   Good  Judgement:  Fair  Insight:  Fair  Psychomotor Activity:  Normal  Concentration:  Concentration: Good and Attention Span: Good  Recall:  Good  Fund of Knowledge:  Good  Language:  Good  Akathisia:  No  Handed:  Right  AIMS (if indicated):     Assets:  Communication Skills Desire for Improvement Financial Resources/Insurance Physical Health Social Support  ADL's:  Intact  Cognition:  WNL  Sleep:       Treatment Plan Summary: Daily contact with patient to assess and evaluate symptoms and progress in treatment, Medication management and Plan is to:  -See MAR and SRA for medication management -Encourage group therapy participation  Observation Level/Precautions:  15 minute checks  Laboratory:  reviewed  Psychotherapy:  Group therapy  Medications:  See [facility]  Consultations:  As needed  Discharge Concerns:  relapse  Estimated LOS: 3-5 days  Other:  Admit to [place] for Primary Diagnosis: Severe recurrent major depression without psychotic features ([place]) Long Term Goal(s): Improvement in symptoms so as ready for discharge  Short Term Goals: Ability to verbalize feelings will improve, Ability to disclose and discuss suicidal ideas and Ability to demonstrate self-control will improve  Physician Treatment Plan for Secondary Diagnosis: Principal Problem:   Severe recurrent major depression without psychotic features ([place]) Active Problems:   Opiate dependence, continuous ([place])   Cocaine abuse ([place])   Amphetamine abuse ([place])  Long Term Goal(s): Improvement in symptoms so as ready for discharge  Short Term Goals: Ability to maintain clinical measurements within normal limits will improve, Compliance with prescribed medications will improve and Ability to identify triggers associated with substance abuse/mental health  issues will improve  I certify that inpatient services furnished can reasonably be expected to improve the patient's condition.    Darnelle Maffucci  B Money, FNP 4/13/20191:40 PM

## 2017-11-14 NOTE — Progress Notes (Signed)
Pt meets inpt criteria per Marissa ConnJason Berry, Marissa Beasley. [facility]. Attending provider will be Dr. Jola Babinskilary, Marissa Beasley. Pt to be sent to Marissa Beasley for medical clearance. Charge nurse Terri, RN has been advised. [facility] for [facility].   Princess BruinsAquicha Mareena Cavan, MSW, LCSW Therapeutic Triage Specialist  [phone]

## 2017-11-14 NOTE — ED Triage Notes (Signed)
Patient states she wants to kill herself. Patient states that she was going to take a lot heroine and overdose.

## 2017-11-14 NOTE — ED Notes (Signed)
Requested patient to urinate. 

## 2017-11-14 NOTE — ED Notes (Signed)
[facility] transport on unit to transfer pt to [facility] Adult unit per MD order. Personal property given to [facility] for transport. Ambulatory off unit.  

## 2017-11-14 NOTE — Progress Notes (Signed)
D.  Pt pleasant but anxious on approach, withdrawal symptoms moderate.  Pt did not feel well enough to attend evening wrap up group tonight as this is her first night on the unit.  Pt did get up later for a snack and did interact with peers at that time.  Pt denies SI/HI/AVH at this time.  A.  Support and encouragement offered, medication given as ordered  R.  Pt remains safe on the unit, will continue to monitor.

## 2017-11-14 NOTE — Progress Notes (Signed)
Psychoeducational Group Note  Date:  11/14/2017 Time: 2030 Group Topic/Focus:  wrap up group  Participation Level: Did Not Attend  Participation Quality:  Not Applicable  Affect:  Not Applicable  Cognitive:  Not Applicable  Insight:  Not Applicable  Engagement in Group: Not Applicable  Additional Comments:  Pt was notified that group was beginning but remained in bed.   Johann CapersMcNeil, Yisel Megill S 11/14/2017, 10:01 PM

## 2017-11-14 NOTE — BH Assessment (Addendum)
Assessment Note  Marissa Beasley is an 37 y.o. female who presents to [facility] as a walk-in voluntarily. Pt states she is currently suicidal and has plans to OD on heroin. Pt states she has been depressed and thinking about suicide for the past several months. Pt identifies her stressors as losing her job in August 2018 causing her to relapse on heroin after being clean for 4 years, not seeing her children "in forever", being homeless, and having no support. Pt states to this writer "I would rather die than continue to live the way I've been doing. If I go back out there I am going to kill myself." Pt also has a hx of self-inflicted wounds. Pt pulled up her sleeves during the assessment and showed this writer cuts she has been making on her arms.   Pt denies HI and denies AVH at present. Pt states she uses heroin and cocaine everyday. Pt has a hx of inpt and OPT treatment and has also been to several substance abuse treatment facilities including [facility], [facility], and [facility].   Pt appeared anxious throughout the assessment asking this writer "how long is this going to take? I'm tired. I want to go to sleep." Pt was informed that she would be sent to [facility] for medical clearance and pt stated if she goes to [facility], she is going to "walk out." Pt states she wants to be in a locked facility where she cannot get out. Pt stated "if I leave, I'm going to kill myself." Pt then asked this writer if she would be able to smoke before she gets admitted and when she was advised that she could not smoke but could be given nicotine patches.  Pt meets inpt criteria per Nira Conn, NP. Pt tentatively accepted to [facility]-1 pending medical clearance. Attending provider will be Dr. Jola Babinski, MD. Pt to be sent to [facility] for medical clearance. Charge nurse Terri, RN has been advised.   Diagnosis: Bipolar I disorder, current episode depressed; Cocaine use disorder, severe; Opioid use disorder, severe   Past Medical History:  Past Medical  History:  Diagnosis Date  . ADD (attention deficit disorder)   . Bipolar disorder   . Depression   . Gestational diabetes   . Heroin addiction     Past Surgical History:  Procedure Laterality Date  . CHOLECYSTECTOMY  2011  . TUBAL LIGATION  2010    Family History:  Family History  Problem Relation Age of Onset  . Diabetes Other     Social History:  reports that she has been smoking cigarettes.  She has been smoking about 2.00 packs per day. She has never used smokeless tobacco. She reports that she has current or past drug history. Drug: Heroin. Frequency: 7.00 times per week. She reports that she does not drink alcohol.  Additional Social History:  Alcohol / Drug Use Pain Medications: See MAR Prescriptions: See MAR Over the Counter: See MAR History of alcohol / drug use?: Yes Longest period of sobriety (when/how long): 4 years, 2014-2018 Substance #1 Name of Substance 1: Heroin 1 - Age of First Use: 30 1 - Amount (size/oz): "a 20" 1 - Frequency: daily 1 - Duration: ongoing 1 - Last Use / Amount: 11/13/17 Substance #2 Name of Substance 2: Cocaine 2 - Age of First Use: 23 2 - Amount (size/oz): varies 2 - Frequency: daily 2 - Duration: ongoing 2 - Last Use / Amount: 11/13/17  CIWA: CIWA-Ar BP: (!) 99/54 Pulse Rate: 82 COWS:  Allergies:  Allergies  Allergen Reactions  . Tylenol [Acetaminophen] Rash    Home Medications:  (Not in a hospital admission)  OB/GYN Status:  No LMP recorded.  General Assessment Data Location of Assessment: [facility] Assessment Services [facility]TS Assessment: In system Is this a Tele or Face-to-Face Assessment?: Face-to-Face Is this an Initial Assessment or a Re-assessment for this encounter?: Initial Assessment Marital status: Single Is patient pregnant?: No Pregnancy Status: No Living Arrangements: Other (Comment)(homeless) Can pt return to current living arrangement?: Yes Admission Status: Voluntary Is patient capable of signing  voluntary admission?: Yes Referral Source: Self/Family/Friend Insurance type: none  Medical Screening Exam [facility] Walk-in ONLY) Medical Exam completed: Yes  Crisis Care Plan Living Arrangements: Other (Comment)(homeless) Name of Psychiatrist: [facility] Name of Therapist: [facility]  Education Status Is patient currently in school?: No Is the patient employed, unemployed or receiving disability?: Unemployed  Risk to self with the past 6 months Suicidal Ideation: Yes-Currently Present Has patient been a risk to self within the past 6 months prior to admission? : Yes Suicidal Intent: Yes-Currently Present Has patient had any suicidal intent within the past 6 months prior to admission? : Yes Is patient at risk for suicide?: Yes Suicidal Plan?: Yes-Currently Present Has patient had any suicidal plan within the past 6 months prior to admission? : Yes Specify Current Suicidal Plan: pt states she has a plan to OD on heroin  Access to Means: Yes Specify Access to Suicidal Means: pt has access to heroin  What has been your use of drugs/alcohol within the last 12 months?: reports to daily crack and heroin use Previous Attempts/Gestures: No Triggers for Past Attempts: None known Intentional Self Injurious Behavior: Cutting Comment - Self Injurious Behavior: pt has hx of cutting on her arms when depressed or stressed  Family Suicide History: No Recent stressful life event(s): Loss (Comment), Job Loss, Financial Problems(has not seen children in years) Persecutory voices/beliefs?: No Depression: Yes Depression Symptoms: Despondent, Tearfulness, Isolating, Fatigue, Loss of interest in usual pleasures, Feeling worthless/self pity, Feeling angry/irritable Substance abuse history and/or treatment for substance abuse?: Yes Suicide prevention information given to non-admitted patients: Not applicable  Risk to Others within the past 6 months Homicidal Ideation: No Does patient have any lifetime risk of  violence toward others beyond the six months prior to admission? : No Thoughts of Harm to Others: No Current Homicidal Intent: No Current Homicidal Plan: No Access to Homicidal Means: No History of harm to others?: No Assessment of Violence: None Noted Does patient have access to weapons?: No Criminal Charges Pending?: No Does patient have a court date: No Is patient on probation?: No  Psychosis Hallucinations: None noted Delusions: None noted  Mental Status Report Appearance/Hygiene: Disheveled Eye Contact: Good Motor Activity: Freedom of movement Speech: Logical/coherent Level of Consciousness: Alert, Irritable Mood: Depressed, Anxious, Despair, Helpless Affect: Anxious, Depressed Anxiety Level: Severe Thought Processes: Relevant, Coherent Judgement: Impaired Orientation: Person, Place, Time, Situation, Appropriate for developmental age Obsessive Compulsive Thoughts/Behaviors: None  Cognitive Functioning Concentration: Normal Memory: Remote Intact, Recent Intact Is patient IDD: No Is patient DD?: No Insight: Poor Impulse Control: Poor Appetite: Good Have you had any weight changes? : No Change Sleep: No Change Total Hours of Sleep: 8 Vegetative Symptoms: None  ADLScreening [facility] Assessment Services) Patient's cognitive ability adequate to safely complete daily activities?: Yes Patient able to express need for assistance with ADLs?: Yes Independently performs ADLs?: Yes (appropriate for developmental age)  Prior Inpatient Therapy Prior Inpatient Therapy: Yes Prior Therapy Dates: 2018  [facility] Reason for Treatment: MDD, SUBSTANCE ABUSE  Prior Outpatient Therapy Prior Outpatient Therapy: Yes Prior Therapy Dates: 2018 Prior Therapy Facilty/Provider(s): [facility] Reason for Treatment: BIPOLAR  Does patient have an ACCT team?: No Does patient have Intensive In-House Services?  : No Does patient have [facility] services? :  Yes Does patient have P4CC services?: No  ADL Screening (condition at time of admission) Patient's cognitive ability adequate to safely complete daily activities?: Yes Is the patient deaf or have difficulty hearing?: No Does the patient have difficulty seeing, even when wearing glasses/contacts?: No Does the patient have difficulty concentrating, remembering, or making decisions?: No Patient able to express need for assistance with ADLs?: Yes Does the patient have difficulty dressing or bathing?: No Independently performs ADLs?: Yes (appropriate for developmental age) Does the patient have difficulty walking or climbing stairs?: No Weakness of Legs: None Weakness of Arms/Hands: None  Home Assistive Devices/Equipment Home Assistive Devices/Equipment: Eyeglasses    Abuse/Neglect Assessment (Assessment to be complete while patient is alone) Abuse/Neglect Assessment Can Be Completed: Yes Physical Abuse: Denies Verbal Abuse: Denies Sexual Abuse: Denies Exploitation of patient/patient's resources: Denies Self-Neglect: Denies     Merchant navy officerAdvance Directives (For Healthcare) Does Patient Have a Medical Advance Directive?: No Would patient like information on creating a medical advance directive?: No - Patient declined    Additional Information 1:1 In Past 12 Months?: No CIRT Risk: No Elopement Risk: No Does patient have medical clearance?: (pending)     Disposition:  Disposition Initial Assessment Completed for this Encounter: Yes Disposition of Patient: Admit Type of inpatient treatment program: Adult([facility]) Patient refused recommended treatment: No  On Site Evaluation by:   Reviewed with Physician:    Karolee OhsAquicha R Jadrian Bulman 11/14/2017 2:42 AM

## 2017-11-14 NOTE — [facility] Group Notes (Signed)
[facility] Group Notes:  (Nursing)  Date:  11/14/2017  Time: 1:15 PM Type of Therapy:  Nurse Education  Participation Level:  Did Not Attend  Participation Quality:  Did not attend  Affect:  Did not attend  Cognitive:  Did not attend  Insight:  None  Engagement in Group:  None  Modes of Intervention:  Discussion, Education and Rapport Building  Summary of Progress/Problems: Patient did not attend group  Shela NevinValerie S Eeshan Verbrugge 11/14/2017, 2:41 PM

## 2017-11-15 DIAGNOSIS — F17210 Nicotine dependence, cigarettes, uncomplicated: Secondary | ICD-10-CM

## 2017-11-15 NOTE — [facility] Group Notes (Signed)
[facility] LCSW Group Therapy Note  Date/Time: 11/15/17, 0900  Type of Therapy/Topic:  Group Therapy:  Feelings about Diagnosis  Participation Level:  Did Not Attend   Mood:   Description of Group:    This group will allow patients to explore their thoughts and feelings about diagnoses they have received. Patients will be guided to explore their level of understanding and acceptance of these diagnoses. Facilitator will encourage patients to process their thoughts and feelings about the reactions of others to their diagnosis, and will guide patients in identifying ways to discuss their diagnosis with significant others in their lives. This group will be process-oriented, with patients participating in exploration of their own experiences as well as giving and receiving support and challenge from other group members.   Therapeutic Goals: 1. Patient will demonstrate understanding of diagnosis as evidence by identifying two or more symptoms of the disorder:  2. Patient will be able to express two feelings regarding the diagnosis 3. Patient will demonstrate ability to communicate their needs through discussion and/or role plays  Summary of Patient Progress:        Therapeutic Modalities:   Cognitive Behavioral Therapy Brief Therapy Feelings Identification   Greg Kysa Calais, LCSW 

## 2017-11-15 NOTE — Progress Notes (Signed)
[facility] MD Progress Note  11/15/2017 10:49 AM Marissa Beasley  MRN:  [id number]   Subjective: Patient reports that she feels horrible today.  She reports stomach cramps, body aches, "bad withdrawals."  She does deny SI/HI/AVH and contracts for safety.  Objective: Patient's chart and findings reviewed and discussed with treatment team.  Patient presents in her bed asleep.  She has been attempted to be woke up couple times this morning.  When awakened this time she goes to take her medications makes the above-stated comments and then goes back to bed.  Patient was quick to get out of bed and go to take her medications as the nurse reported she had not taken them this morning.  Patient appears to be in withdrawals that she appears restless, anxious, depressed, and has flat affect.  Principal Problem: Severe recurrent major depression without psychotic features ([place]) Diagnosis:   Patient Active Problem List   Diagnosis Date Noted  . Severe recurrent major depression without psychotic features ([place]) [F33.2] 11/14/2017  . Opiate dependence, continuous ([place]) [F11.20] 11/14/2017  . Cocaine abuse ([place]) [F14.10] 11/14/2017  . Amphetamine abuse ([place]) [F15.10] 11/14/2017  . BACTERIAL VAGINITIS [N76.0] 01/04/2010  . VAGINAL DISCHARGE [N89.8] 01/04/2010   Total Time spent with patient: 15 minutes  Past Psychiatric History: See H&P  Past Medical History:  Past Medical History:  Diagnosis Date  . ADD (attention deficit disorder)   . Bipolar disorder ([place])   . Depression   . Gestational diabetes   . Heroin addiction [facility])     Past Surgical History:  Procedure Laterality Date  . CHOLECYSTECTOMY  2011  . TUBAL LIGATION  2010   Family History:  Family History  Problem Relation Age of Onset  . Diabetes Other    Family Psychiatric  History: See H&P Social History:  Social History   Substance and Sexual Activity  Alcohol Use No     Social History   Substance and Sexual Activity  Drug Use  Yes  . Frequency: 7.0 times per week  . Types: Heroin   Comment: heroin    Social History   Socioeconomic History  . Marital status: Single    Spouse name: Not on file  . Number of children: Not on file  . Years of education: Not on file  . Highest education level: Not on file  Occupational History  . Not on file  Social Needs  . Financial resource strain: Not on file  . Food insecurity:    Worry: Not on file    Inability: Not on file  . Transportation needs:    Medical: Not on file    Non-medical: Not on file  Tobacco Use  . Smoking status: Current Every Day Smoker    Packs/day: 2.00    Types: Cigarettes  . Smokeless tobacco: Never Used  Substance and Sexual Activity  . Alcohol use: No  . Drug use: Yes    Frequency: 7.0 times per week    Types: Heroin    Comment: heroin  . Sexual activity: Not on file  Lifestyle  . Physical activity:    Days per week: Not on file    Minutes per session: Not on file  . Stress: Not on file  Relationships  . Social connections:    Talks on phone: Not on file    Gets together: Not on file    Attends religious service: Not on file    Active member of club or organization: Not on file  Attends meetings of clubs or organizations: Not on file    Relationship status: Not on file  Other Topics Concern  . Not on file  Social History Narrative  . Not on file   Additional Social History:                         Sleep: Good  Appetite:  Fair  Current Medications: Current Facility-Administered Medications  Medication Dose Route Frequency Provider Last Rate Last Dose  . alum & mag hydroxide-simeth (MAALOX/MYLANTA) 200-200-20 MG/5ML suspension 30 mL  30 mL Oral Q4H PRN Lindon Romp A, NP      . cloNIDine (CATAPRES) tablet 0.1 mg  0.1 mg Oral QID Sharma Covert, MD   0.1 mg at 11/15/17 1038   Followed by  . [START ON 11/16/2017] cloNIDine (CATAPRES) tablet 0.1 mg  0.1 mg Oral BH-qamhs Clary, Cordie Grice, MD        Followed by  . [START ON 11/19/2017] cloNIDine (CATAPRES) tablet 0.1 mg  0.1 mg Oral QAC breakfast Sharma Covert, MD      . dicyclomine (BENTYL) tablet 20 mg  20 mg Oral Q6H PRN Sharma Covert, MD   20 mg at 11/15/17 1041  . feeding supplement (ENSURE ENLIVE) (ENSURE ENLIVE) liquid 237 mL  237 mL Oral BID BM Marely Apgar, Darnelle Maffucci B, FNP   237 mL at 11/15/17 1038  . hydrOXYzine (ATARAX/VISTARIL) tablet 25 mg  25 mg Oral Q6H PRN Sharma Covert, MD   25 mg at 11/14/17 1822  . loperamide (IMODIUM) capsule 2-4 mg  2-4 mg Oral PRN Sharma Covert, MD      . magnesium hydroxide (MILK OF MAGNESIA) suspension 30 mL  30 mL Oral Daily PRN Lindon Romp A, NP      . methocarbamol (ROBAXIN) tablet 500 mg  500 mg Oral Q8H PRN Sharma Covert, MD   500 mg at 11/14/17 2106  . naproxen (NAPROSYN) tablet 500 mg  500 mg Oral BID PRN Sharma Covert, MD   500 mg at 11/15/17 1041  . nicotine (NICODERM CQ - dosed in mg/24 hours) patch 21 mg  21 mg Transdermal Daily Bryonna Sundby, Darnelle Maffucci B, FNP   21 mg at 11/14/17 1511  . ondansetron (ZOFRAN-ODT) disintegrating tablet 4 mg  4 mg Oral Q6H PRN Sharma Covert, MD   4 mg at 11/14/17 2106  . traZODone (DESYREL) tablet 100 mg  100 mg Oral QHS Lindon Romp A, NP   100 mg at 11/14/17 2106    Lab Results:  Results for orders placed or performed during the hospital encounter of 11/14/17 (from the past 48 hour(s))  Comprehensive metabolic panel     Status: Abnormal   Collection Time: 11/14/17  3:37 AM  Result Value Ref Range   Sodium 139 135 - 145 mmol/L   Potassium 4.0 3.5 - 5.1 mmol/L   Chloride 105 101 - 111 mmol/L   CO2 23 22 - 32 mmol/L   Glucose, Bld 128 (H) 65 - 99 mg/dL   BUN 15 6 - 20 mg/dL   Creatinine, Ser 0.85 0.44 - 1.00 mg/dL   Calcium 9.1 8.9 - 10.3 mg/dL   Total Protein 7.0 6.5 - 8.1 g/dL   Albumin 3.6 3.5 - 5.0 g/dL   AST 22 15 - 41 U/L   ALT 19 14 - 54 U/L   Alkaline Phosphatase 94 38 - 126 U/L   Total Bilirubin 1.2 0.3 - 1.2 mg/dL  GFR  calc non Af Amer >60 >60 mL/min   GFR calc Af Amer >60 >60 mL/min    Comment: (NOTE) The eGFR has been calculated using the CKD EPI equation. This calculation has not been validated in all clinical situations. eGFR's persistently <60 mL/min signify possible Chronic Kidney Disease.    Anion gap 11 5 - 15    Comment: Performed at [facility], [place] [address] [place], [place] [address]  Ethanol     Status: None   Collection Time: 11/14/17  3:37 AM  Result Value Ref Range   Alcohol, Ethyl (B) <10 <10 mg/dL    Comment:        LOWEST DETECTABLE LIMIT FOR SERUM ALCOHOL IS 10 mg/dL FOR MEDICAL PURPOSES ONLY Performed at [place] [address], [place], [place] [address]   Salicylate level     Status: None   Collection Time: 11/14/17  3:37 AM  Result Value Ref Range   Salicylate Lvl <9.7 2.8 - 30.0 mg/dL    Comment: Performed at [facility], [place] [address], [place], [place] [address]  Acetaminophen level     Status: Abnormal   Collection Time: 11/14/17  3:37 AM  Result Value Ref Range   Acetaminophen (Tylenol), Serum <10 (L) 10 - 30 ug/mL    Comment:        THERAPEUTIC CONCENTRATIONS VARY SIGNIFICANTLY. A RANGE OF 10-30 ug/mL MAY BE AN EFFECTIVE CONCENTRATION FOR MANY PATIENTS. HOWEVER, SOME ARE BEST TREATED AT CONCENTRATIONS OUTSIDE THIS RANGE. ACETAMINOPHEN CONCENTRATIONS >150 ug/mL AT 4 HOURS AFTER INGESTION AND >50 ug/mL AT 12 HOURS AFTER INGESTION ARE OFTEN ASSOCIATED WITH TOXIC REACTIONS. Performed at [facility], [place] [address] [place], [place] [address]   cbc     Status: Abnormal   Collection Time: 11/14/17  3:37 AM  Result Value Ref Range   WBC 7.0 4.0 - 10.5 K/uL   RBC 3.62 (L) 3.87 - 5.11 MIL/uL   Hemoglobin 8.6 (L) 12.0 - 15.0 g/dL   HCT 33.3 (L) 36.0 - 46.0 %   MCV 92.0 78.0 - 100.0 fL   MCH 23.8 (L) 26.0 - 34.0 pg   MCHC 25.8 (L) 30.0 - 36.0 g/dL   RDW 13.8 11.5 - 15.5 %    Platelets 178 150 - 400 K/uL    Comment: Performed at [facility], [place] [address] [place],  [address]  I-Stat beta hCG blood, ED     Status: None   Collection Time: 11/14/17  3:42 AM  Result Value Ref Range   I-stat hCG, quantitative <5.0 <5 mIU/mL   Comment 3            Comment:   GEST. AGE      CONC.  (mIU/mL)   <=1 WEEK        5 - 50     2 WEEKS       50 - 500     3 WEEKS       100 - 10,000     4 WEEKS     1,000 - 30,000        FEMALE AND NON-PREGNANT FEMALE:     LESS THAN 5 mIU/mL   I-stat chem 8, ed     Status: None   Collection Time: 11/14/17  5:36 AM  Result Value Ref Range   Sodium 142 135 - 145 mmol/L   Potassium 3.7 3.5 - 5.1 mmol/L   Chloride 103 101 -  111 mmol/L   BUN 15 6 - 20 mg/dL   Creatinine, Ser 0.90 0.44 - 1.00 mg/dL   Glucose, Bld 96 65 - 99 mg/dL   Calcium, Ion 1.18 1.15 - 1.40 mmol/L   TCO2 30 22 - 32 mmol/L   Hemoglobin 12.9 12.0 - 15.0 g/dL   HCT 38.0 36.0 - 46.0 %  Rapid urine drug screen (hospital performed)     Status: Abnormal   Collection Time: 11/14/17  6:28 AM  Result Value Ref Range   Opiates POSITIVE (A) NONE DETECTED   Cocaine POSITIVE (A) NONE DETECTED   Benzodiazepines NONE DETECTED NONE DETECTED   Amphetamines POSITIVE (A) NONE DETECTED   Tetrahydrocannabinol NONE DETECTED NONE DETECTED   Barbiturates NONE DETECTED NONE DETECTED    Comment: (NOTE) DRUG SCREEN FOR MEDICAL PURPOSES ONLY.  IF CONFIRMATION IS NEEDED FOR ANY PURPOSE, NOTIFY LAB WITHIN 5 DAYS. LOWEST DETECTABLE LIMITS FOR URINE DRUG SCREEN Drug Class                     Cutoff (ng/mL) Amphetamine and metabolites    1000 Barbiturate and metabolites    200 Benzodiazepine                 263 Tricyclics and metabolites     300 Opiates and metabolites        300 Cocaine and metabolites        300 THC                            50 Performed at [facility], [place] [address] [place], [place] [address]     Blood Alcohol  level:  Lab Results  Component Value Date   [facility] <10 11/14/2017   ETH <11 50/27/7412    Metabolic Disorder Labs: No results found for: HGBA1C, MPG No results found for: PROLACTIN No results found for: CHOL, TRIG, HDL, CHOLHDL, VLDL, LDLCALC  Physical Findings: AIMS: Facial and Oral Movements Muscles of Facial Expression: None, normal Lips and Perioral Area: None, normal Jaw: None, normal Tongue: None, normal,Extremity Movements Upper (arms, wrists, hands, fingers): None, normal Lower (legs, knees, ankles, toes): None, normal, Trunk Movements Neck, shoulders, hips: None, normal, Overall Severity Severity of abnormal movements (highest score from questions above): None, normal Incapacitation due to abnormal movements: None, normal Patient's awareness of abnormal movements (rate only patient's report): No Awareness, Dental Status Current problems with teeth and/or dentures?: No Does patient usually wear dentures?: No  CIWA:  CIWA-Ar Total: 8 COWS:  COWS Total Score: 4  Musculoskeletal: Strength & Muscle Tone: within normal limits Gait & Station: normal Patient leans: N/A  Psychiatric Specialty Exam: Physical Exam  Nursing note and vitals reviewed. Constitutional: She is oriented to person, place, and time. She appears well-developed and well-nourished.  Cardiovascular: Normal rate.  Respiratory: Effort normal.  Musculoskeletal: Normal range of motion.  Neurological: She is alert and oriented to person, place, and time.  Skin: Skin is warm.    Review of Systems  Constitutional: Negative.   HENT: Negative.   Eyes: Negative.   Respiratory: Negative.   Cardiovascular: Negative.   Gastrointestinal: Negative.   Genitourinary: Negative.   Musculoskeletal: Negative.   Skin: Negative.   Neurological: Negative.   Endo/Heme/Allergies: Negative.   Psychiatric/Behavioral: Positive for depression and substance abuse. Negative for hallucinations and suicidal ideas.    Blood  pressure (!) 112/51, pulse 96, temperature 97.8 F (36.6 C), temperature source  Oral, resp. rate 16, height _0  (1.702 m), weight 62.6 kg (138 lb), last menstrual period 11/12/2017, SpO2 100 %.Body mass index is 21.61 kg/m.  General Appearance: Disheveled  Eye Contact:  Minimal  Speech:  Clear and Coherent and Normal Rate  Volume:  Decreased  Mood:  Depressed  Affect:  Flat  Thought Process:  Linear and Descriptions of Associations: Intact  Orientation:  Full (Time, Place, and Person)  Thought Content:  WDL  Suicidal Thoughts:  No  Homicidal Thoughts:  No  Memory:  Immediate;   Good Recent;   Good Remote;   Good  Judgement:  Poor  Insight:  Fair  Psychomotor Activity:  Restlessness  Concentration:  Concentration: Good and Attention Span: Good  Recall:  Good  Fund of Knowledge:  Good  Language:  Good  Akathisia:  No  Handed:  Right  AIMS (if indicated):     Assets:  Desire for Improvement Resilience  ADL's:  Intact  Cognition:  WNL  Sleep:  Number of Hours: 5.75   Problems addressed MDD severe Opiate dependence Cocaine abuse Amphetamine abuse  Treatment Plan Summary: Daily contact with patient to assess and evaluate symptoms and progress in treatment, Medication management and Plan is to:  -Continue clonidine detox protocol -Continue Vistaril 25 mg every 6 as needed for anxiety -Continue trazodone 100 mg nightly for sleep -Encourage group therapy participation  Lewis Shock, FNP 11/15/2017, 10:49 AM

## 2017-11-15 NOTE — [facility] Group Notes (Signed)
[facility] Group Notes:  (Nursing/MHT/Case Management/Adjunct)  Date:  11/15/2017  Time:  1:15 pm  Type of Therapy:  Psychoeducational Skills  Participation Level:  Did Not Attend  Participation Quality:    Affect:    Cognitive:    Insight:    Engagement in Group:    Modes of Intervention:    Summary of Progress/Problems:  Earline MayotteKnight, Ashawn Rinehart Shephard 11/15/2017, 3:14 PM

## 2017-11-15 NOTE — Progress Notes (Signed)
NUTRITION ASSESSMENT  Pt identified as at risk on the Malnutrition Screen Tool  INTERVENTION: 1. Supplements: Continue Ensure Enlive po BID, each supplement provides 350 kcal and 20 grams of protein  NUTRITION DIAGNOSIS: Unintentional weight loss related to sub-optimal intake as evidenced by pt report.   Goal: Pt to meet >/= 90% of their estimated nutrition needs.  Monitor:  PO intake  Assessment:  Pt admitted with depression and substance abuse (heroin). Pt currently experiencing withdrawal symptoms. Pt has not had any weight loss. Will continue Ensure supplements, however.   Height: Ht Readings from Last 1 Encounters:  11/14/17 5\' 7"  (1.702 m)    Weight: Wt Readings from Last 1 Encounters:  11/14/17 138 lb (62.6 kg)    Weight Hx: Wt Readings from Last 10 Encounters:  11/14/17 138 lb (62.6 kg)  11/14/17 140 lb (63.5 kg)  07/20/13 145 lb (65.8 kg)  01/12/12 134 lb (60.8 kg)  12/10/11 140 lb (63.5 kg)    BMI:  Body mass index is 21.61 kg/m. Pt meets criteria for normal based on current BMI.  Estimated Nutritional Needs: Kcal: 25-30 kcal/kg Protein: > 1 gram protein/kg Fluid: 1 ml/kcal  Diet Order: Diet regular Room service appropriate? Yes; Fluid consistency: Thin Pt is also offered choice of unit snacks mid-morning and mid-afternoon.  Pt is eating as desired.   Lab results and medications reviewed.   Tilda FrancoLindsey Undra Harriman, MS, RD, LDN [facility] Clinical Dietitian Pager: [phone] After Hours Pager: [phone]

## 2017-11-15 NOTE — Progress Notes (Signed)
D. Pt resting in bed with eyes closed- appeared to be sleeping- upon initial approach. Pt reports having slept poorly last night and complains of withdrawal symptoms (chilling, cravings, cramping, nausea, runny nose), but declines medication for symptoms at this time.  Per pt's self inventory, pt rates her depression, hopelessness and anxiety a 05/13/09, respectively. Pt currently denies SI/HI and AV hallucinations.   A. Labs and vitals monitored. Pt provided with pitcher of ice water at bedside and encouraged to drink.Pt supported emotionally and encouraged to express concerns and ask questions.   R. Pt remains safe with 15 minute checks. Will continue POC.

## 2017-11-15 NOTE — Progress Notes (Signed)
Patient observed participating in group recreation therapy this afternoon. Reports some relief from withdrawal symptoms this afternoon. Patient now resting in bed with eyes closed- appears to be sleeping.

## 2017-11-15 NOTE — [facility] Counselor (Signed)
Clinical Social Work Note  Two attempts made to do Psychosocial Assessment with patient, at 8:30am and at 10:30am.  She was not feeling well and refused at those times.  CSW to continue attempts.  Ambrose MantleMareida Grossman-Orr, LCSW 11/15/2017, 10:42 AM

## 2017-11-15 NOTE — [facility] Counselor (Signed)
Adult Comprehensive Assessment  Patient ID: Marissa Beasley, female   DOB: [birth year], 37 y.o.   MRN: [id number]  Information Source: Information source: Patient  Current Stressors:  Educational / Learning stressors: Pt denies Employment / Job issues: Pt denies Family Relationships: "I don't have a family" and this bothers me.  Financial / Lack of resources (include bankruptcy): Pt reports that this is stressful because "I don't have any money". Housing / Lack of housing: "I am homeless" Physical health (include injuries & life threatening diseases): Pt denies Social relationships: "I dont have any friends" Substance abuse: "I shoot heroin and smoke crack. This is why I came here because I dont want to live anymore."  Bereavement / Loss: Pt denies  Living/Environment/Situation:  Living Arrangements: Other (Comment)(Pt reports being homeless.) Living conditions (as described by patient or guardian): on the streets   How long has patient lived in current situation?: two months What is atmosphere in current home: Dangerous, Temporary  Family History:  Marital status: Long term relationship Long term relationship, how long?: Been with a boyfriend for over a year.  What types of issues is patient dealing with in the relationship?: Denies any issues. "Boyfriend does not like my drug use and does not want me to be homeless alone."  Additional relationship information: "He is the only support system I have." Patient reports that her boyfriend is in the [facility] in a different unit but refuses to provide additional information.  Are you sexually active?: Yes What is your sexual orientation?: Straight Has your sexual activity been affected by drugs, alcohol, medication, or emotional stress?: I don't know but being homeless has affected it. "you can't find anywhere to do it." Does patient have children?: Yes How many children?: 2 How is patient's relationship with their children?: I have  two girls that are 9 and 15. I can't see them because I am using drugs. Talk to oldest on instagram some.   Childhood History:  By whom was/is the patient raised?: Both parents Additional childhood history information: Relationship with both parents was fine. Patient's description of current relationship with people who raised him/her: Both parents are done with me because I do drugs. Since my relapse 4 months ago, they have been done. (4 years clean before my relapse) How were you disciplined when you got in trouble as a child/adolescent?: I got yelled at and spanked if I needed it.  Does patient have siblings?: Yes Number of Siblings: 2 Description of patient's current relationship with siblings: One older and younger brothers. I don't have a relationship with my brothers now.  Did patient suffer any verbal/emotional/physical/sexual abuse as a child?: No Did patient suffer from severe childhood neglect?: No Has patient ever been sexually abused/assaulted/raped as an adolescent or adult?: No Was the patient ever a victim of a crime or a disaster?: No Witnessed domestic violence?: No Has patient been effected by domestic violence as an adult?: No  Education:  Highest grade of school patient has completed: GED Currently a student?: No Learning disability?: Yes What learning problems does patient have?: I had issues but it was never addressed. I have been diagnosed with ADD as an adult but struggled in classroom setting as a kid.   Employment/Work Situation:   Employment situation: Unemployed Patient's job has been impacted by current illness: Yes Describe how patient's job has been impacted: My not being able to pass a drug test has stopped me from being able to get a jo. 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 Crack - daily. Alcohol/Substance Abuse Treatment Hx: Past Tx, Inpatient, Past detox If yes, describe treatment: AA/NA in the past. [facility], [facility] and [facility].  Has alcohol/substance abuse ever caused legal problems?: Yes(Just put no, it was in a different state. )  Social Support System:   Patient's Community Support System: Poor Describe Community Support System: If I reach out to them then NA groups will support. Boyfriend is the bigest support.  Type of faith/religion: No.  Leisure/Recreation:   Leisure and Hobbies: Read  Strengths/Needs:   What things does the patient do well?: Nothing right now. I am good at math and selling drugs.  In what areas does patient struggle / problems for patient: Substance abuse, detoxing currently sick, stomach hurts, hurts to talk, joints hurt.   Discharge Plan:   Does patient have access to transportation?: No Plan for no access to transportation at discharge: CSW needs to explore options. Will patient be returning to same living situation after discharge?: No Plan for living situation after discharge: Would like to explore options for treatment. Is interested in rehab. Currently receiving community mental health services: No If no, would patient like referral for services when discharged?: Yes (What county?)(Pt would like to explore treatment options - Requesting [facility]) Does patient have financial barriers related to discharge medications?: Yes Patient description of barriers related to discharge medications: No  income, No insurance.  Summary/Recommendations:   Summary and Recommendations (to be completed by the evaluator): Patient is a 37 yo female admitted with suicidal ideation and plans to OD on heroin.  Primary stressors include her struggle with substance abuse with heroin and crack cocaine, being homeless, not having income and estranged family relationships. Patient stated being in a lot of pain due to not getting subutex to detox as she has at other facilities. Patient reports that she has relapsed in August after almost four years of sobriety. Patient will benefit from crisis stabilization, medication evaluation, group therapy and psychoeducation, in addition to case management for discharge planning. At discharge it is recommended that Patient adhere to the established discharge plan and continue in treatment.  Shellia CleverlyStephanie N Anastasija Anfinson. 11/15/2017

## 2017-11-15 NOTE — [facility] Suicide Risk Assessment (Signed)
[facility] INPATIENT:  Family/Significant Other Suicide Prevention Education  Suicide Prevention Education:  Patient Refusal for Family/Significant Other Suicide Prevention Education: The patient Lenda KelpJessica L Scicchitano has refused to provide written consent for family/significant other to be provided Family/Significant Other Suicide Prevention Education during admission and/or prior to discharge.  Physician notified.  Shellia CleverlyStephanie N Ellison Rieth 11/15/2017, 3:09 PM

## 2017-11-16 MED ORDER — HYDROXYZINE HCL 25 MG PO TABS: 25.0000 mg | ORAL_TABLET | Freq: Four times a day (QID) | ORAL | 0 refills | Status: AC | PRN

## 2017-11-16 MED ORDER — TRAZODONE HCL 100 MG PO TABS: 100.0000 mg | ORAL_TABLET | Freq: Every day | ORAL | 0 refills | Status: AC

## 2017-11-16 NOTE — Progress Notes (Signed)
Recreation Therapy Notes  Date: 4.15.19 Time: 9:30 a.m. Location: [address] Dayroom   Group Topic: Stress Management   Goal Area(s) Addresses:  Goal 1.1: To reduce stress  -Patient will feel a reduction in stress level  -Patient will understand the importance of stress management  -Patient will participate during stress management group      Intervention: Stress Management  Activity: Guided Imagery: Patients were in a peaceful environment with soft lighting enhancing patients mood. Patients listened to a guided imagery script read by Recreation Therapy Intern.  Education: Stress Management, Discharge Planning.    Education Outcome: Acknowledges edcuation/In group clarification offered/Needs additional education   Clinical Observations/Feedback:: Patient did not attend     Gabriell Casimir, Recreation Therapy Intern   Kaye Luoma 11/16/2017 9:05 AM 

## 2017-11-16 NOTE — Discharge Summary (Signed)
Physician Discharge Summary Note  Patient:  Marissa Beasley is an 37 y.o., female MRN:  [id number] DOB:  February 08, 1981 Patient phone:  [phone] (home)  Patient address:   [address] [place] [address],  Total Time spent with patient: 20 minutes  Date of Admission:  11/14/2017 Date of Discharge: 11/16/17  Reason for Admission:  Worsening depression with SI  Principal Problem: Severe recurrent major depression without psychotic features [facility](HCC) Discharge Diagnoses: Patient Active Problem List   Diagnosis Date Noted  . Severe recurrent major depression without psychotic features (HCC) [F33.2] 11/14/2017  . 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 Cocaine abuse (HCC) [F14.10] 11/14/2017  . 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 BACTERIAL VAGINITIS [N76.0] 01/04/2010  . VAGINAL DISCHARGE [N89.8] 01/04/2010    Past Psychiatric History: MDD, Opiate dependence, cocaine abuse, amphetamine abuse  Past Medical History:  Past Medical History:  Diagnosis Date  . ADD (attention deficit disorder)   . Bipolar disorder (HCC)   . Depression   . Gestational diabetes   . Heroin addiction [facility](HCC)     Past Surgical History:  Procedure Laterality Date  . CHOLECYSTECTOMY  2011  . TUBAL LIGATION  2010   Family History:  Family History  Problem Relation Age of Onset  . Diabetes Other    Family Psychiatric  History: Denies Social History:  Social History   Substance and Sexual Activity  Alcohol Use No     Social History   Substance and Sexual Activity  Drug Use Yes  . Frequency: 7.0 times per week  . Types: Heroin   Comment: heroin    Social History   Socioeconomic History  . Marital status: Single    Spouse name: Not on file  . Number of children: Not on file  . Years of education: Not on file  . Highest education level: Not on file  Occupational History  . Not on file  Social Needs  . Financial resource strain: Not on file  . Food  insecurity:    Worry: Not on file    Inability: Not on file  . Transportation needs:    Medical: Not on file    Non-medical: Not on file  Tobacco Use  . Smoking status: Current Every Day Smoker    Packs/day: 2.00    Types: Cigarettes  . Smokeless tobacco: Never Used  Substance and Sexual Activity  . Alcohol use: No  . Drug use: Yes    Frequency: 7.0 times per week    Types: Heroin    Comment: heroin  . Sexual activity: Not on file  Lifestyle  . Physical activity:    Days per week: Not on file    Minutes per session: Not on file  . Stress: Not on file  Relationships  . Social connections:    Talks on phone: Not on file    Gets together: Not on file    Attends religious service: Not on file    Active member of club or organization: Not on file    Attends meetings of clubs or organizations: Not on file    Relationship status: Not on file  Other Topics Concern  . Not on file  Social History Narrative  . Not on file    Hospital Course:   [facility]: 37 y.o.femalewho [facility].Pt states she is currently suicidal and has plans to OD on heroin. Pt states she has been depressed and thinking about suicide  for the past several months. Pt identifies her stressors as losing her job in August 2018 causing her to relapse on heroin after being clean for 4 years, not seeing her children "in forever", being homeless, and having no support. Pt states to this writer "I would rather die than continue to live the way I've been doing. If I go back out there I am going to kill myself." Pt also has a hx of self-inflicted wounds. Pt pulled up her sleeves during the assessment and showed this writer cuts she has been making on her arms.  Pt denies HI and denies AVH at present. Pt states she uses heroin and cocaine everyday. Pt has a hx of inpt and OPT treatment and has also been to several substance abuse treatment facilities including [facility], [facility],  and [facility].  Pt appeared anxious throughout the assessment asking this writer "how long is this going to take? I'm tired. I want to go to sleep." Pt was informed that she would be sent to [facility] for medical clearance and pt stated if she goes to [facility], she is going to "walk out." Pt states she wants to be in a locked facility where she cannot get out. Pt stated "if I leave, I'm going to kill myself." Pt then asked this writer if she would be able to smoke before she gets admitted and when she was advised that she could not smoke but could be given nicotine patches.  11/14/17 [facility] MD SRA Assessment: Patient is seen and examined. Patient is a 37 year old female with a past psychiatric history significant for opiate dependence as well as cocaine use disorder. She presented with suicidal ideation to the emergency department and was admitted here for evaluation and stabilization. She is requesting detox and rehabilitation stay. She appears to lack motivation for that at this time. She will be admitted to the hospital and integrated into the milieu. She will go to groups for coping skills as well as substance abuse treatment. She will be seen individually by social work to assess capability of getting into a rehabilitation facility. She will be monitored every 15 minutes for withdrawal syndromes.  11/14/17 [facility] MD Assessment: Patient walks past me as I try to speak to her. She refuses to answer questions. "I'm going to bed. I didn't talk about medications with the other doctor. They kept me up all night at the hospital." She returns to her room and closes her door. I attempt to ask her more questions but she just lies in bed and refuses to answer.  Patient remained on the [facility] unit for 2 days. The patient stabilized on medication and therapy. Patient was discharged on only PRN medications and partially completed clonidine detox. Patient has shown improvement with improved mood, affect, sleep, appetite, and  interaction. Patient has attended group and participated. Patient has been seen in the day room interacting with peers and staff appropriately. Patient denies any SI/HI/AVH and contracts for safety. Patient agrees to follow up at her PCP Dr. Drue Second. Patient is provided with prescriptions for their medications upon discharge.     Physical Findings: AIMS: Facial and Oral Movements Muscles of Facial Expression: None, normal Lips and Perioral Area: None, normal Jaw: None, normal Tongue: None, normal,Extremity Movements Upper (arms, wrists, hands, fingers): None, normal Lower (legs, knees, ankles, toes): None, normal, Trunk Movements Neck, shoulders, hips: None, normal, Overall Severity Severity of abnormal movements (highest score from questions above): None, normal Incapacitation due to abnormal movements: None, normal Patient's awareness of  abnormal movements (rate only patient's report): No Awareness, Dental Status Current problems with teeth and/or dentures?: No Does patient usually wear dentures?: No  CIWA:  CIWA-Ar Total: 8 COWS:  COWS Total Score: 3  Musculoskeletal: Strength & Muscle Tone: within normal limits Gait & Station: normal Patient leans: N/A  Psychiatric Specialty Exam: Physical Exam  Nursing note and vitals reviewed. Constitutional: She is oriented to person, place, and time. She appears well-developed and well-nourished.  Cardiovascular: Normal rate.  Respiratory: Effort normal.  Musculoskeletal: Normal range of motion.  Neurological: She is alert and oriented to person, place, and time.  Skin: Skin is warm.    Review of Systems  Constitutional: Negative.   HENT: Negative.   Eyes: Negative.   Respiratory: Negative.   Cardiovascular: Negative.   Gastrointestinal: Negative.   Genitourinary: Negative.   Musculoskeletal: Negative.   Skin: Negative.   Neurological: Negative.   Endo/Heme/Allergies: Negative.   Psychiatric/Behavioral: Negative.     Blood  pressure 101/71, pulse 80, temperature 98.4 F (36.9 C), temperature source Oral, resp. rate 16, height 5\' 7"  (1.702 m), weight 62.6 kg (138 lb), last menstrual period 11/12/2017, SpO2 100 %.Body mass index is 21.61 kg/m.  General Appearance: Casual  Eye Contact:  Good  Speech:  Clear and Coherent and Normal Rate  Volume:  Normal  Mood:  Euthymic  Affect:  Congruent  Thought Process:  Goal Directed and Descriptions of Associations: Intact  Orientation:  Full (Time, Place, and Person)  Thought Content:  WDL  Suicidal Thoughts:  No  Homicidal Thoughts:  No  Memory:  Immediate;   Good Recent;   Good Remote;   Good  Judgement:  Fair  Insight:  Fair  Psychomotor Activity:  Normal  Concentration:  Concentration: Good and Attention Span: Good  Recall:  Good  Fund of Knowledge:  Good  Language:  Good  Akathisia:  No  Handed:  Right  AIMS (if indicated):     Assets:  Communication Skills Desire for Improvement Resilience Social Support  ADL's:  Intact  Cognition:  WNL  Sleep:  Number of Hours: 6.5     Have you used any form of tobacco in the last 30 days? (Cigarettes, Smokeless Tobacco, Cigars, and/or Pipes): Yes  Has this patient used any form of tobacco in the last 30 days? (Cigarettes, Smokeless Tobacco, Cigars, and/or Pipes) Yes, Yes, A prescription for an FDA-approved tobacco cessation medication was offered at discharge and the patient refused  Blood Alcohol level:  Lab Results  Component Value Date   [facility] <10 11/14/2017   ETH <11 01/12/2012    Metabolic Disorder Labs:  No results found for: HGBA1C, MPG No results found for: PROLACTIN No results found for: CHOL, TRIG, HDL, CHOLHDL, VLDL, LDLCALC  See Psychiatric Specialty Exam and Suicide Risk Assessment completed by Attending Physician prior to discharge.  Discharge destination:  Home  Is patient on multiple antipsychotic therapies at discharge:  No   Has Patient had three or more failed trials of antipsychotic  monotherapy by history:  No  Recommended Plan for Multiple Antipsychotic Therapies: NA   Allergies as of 11/16/2017      Reactions   Tylenol [acetaminophen] Rash      Medication List    TAKE these medications     Indication  hydrOXYzine 25 MG tablet Commonly known as:  ATARAX/VISTARIL Take 1 tablet (25 mg total) by mouth every 6 (six) hours as needed for anxiety.  Indication:  Feeling Anxious   traZODone 100 MG tablet  Commonly known as:  DESYREL Take 1 tablet (100 mg total) by mouth at bedtime. For sleep What changed:  additional instructions  Indication:  Trouble Sleeping      Follow-up Information    Dalbert Mayotte, MD Follow up.   Specialty:  Family Medicine Why:  CSW will make an appointment. Contact information: [address] [place] [address] [phone]           Follow-up recommendations:  Continue activity as tolerated. Continue diet as recommended by your PCP. Ensure to keep all appointments with outpatient providers.  Comments:  Patient is instructed prior to discharge to: Take all medications as prescribed by his/her mental healthcare provider. Report any adverse effects and or reactions from the medicines to his/her outpatient provider promptly. Patient has been instructed & cautioned: To not engage in alcohol and or illegal drug use while on prescription medicines. In the event of worsening symptoms, patient is instructed to call the crisis hotline, 911 and or go to the nearest ED for appropriate evaluation and treatment of symptoms. To follow-up with his/her primary care provider for your other medical issues, concerns and or health care needs.    Signed: Gerlene Burdock Yarisbel Miranda, FNP 11/16/2017, 10:06 AM

## 2017-11-16 NOTE — Progress Notes (Signed)
  [facility] Discharge [facility] :  Will you be returning to the same living situation after discharge:  Yes,  patient plans to seek possible housing at a shelter at discharge At discharge, do you have transportation home?: Yes,  bus passes Do you have the ability to pay for your medications: No.  Release of information consent forms completed and in the chart;  Patient's signature needed at discharge.  Patient to Follow up at: Follow-up Information    Dalbert MayotteSnider, Alison, MD Follow up.   Specialty:  Family Medicine Why:  CSW unable to schedule an appointment prior to the patient's discharge. Please be sure to follow up with your primary care provider within 7 days of discharge from the hospital for medication management services. Contact information: [address] [place] [address] [phone]        [facility] Follow up.   Specialty:  Addiction Medicine Why:  There are no beds currently.  CSW unable to place patient at facility prior to discharge. Please be sure to call the facility daily for updates on possible beds and admission.  Contact information: [address] [place] [address] [phone]           Next level of care provider has access to [facility] Link:yes  Safety Planning and Suicide Prevention discussed: Yes,  with the patient  Have you used any form of tobacco in the last 30 days? (Cigarettes, Smokeless Tobacco, Cigars, and/or Pipes): Yes  Has patient been referred to the Quitline?: Patient refused referral  Patient has been referred for addiction treatment: Yes  Maeola SarahJolan E Basha Krygier, LCSWA 11/16/2017, 12:07 PM

## 2017-11-16 NOTE — Progress Notes (Signed)
Pt d/c to lobby meeting another pt that had recently been discharged. All items returned. D/C instructions given, prescriptions given and samples given.

## 2017-11-16 NOTE — Progress Notes (Signed)
Pt is irritable about upcoming discharge. She refused to verbally answer question and shrugged her shoulders when asked about si thoughts.

## 2017-11-16 NOTE — Progress Notes (Signed)
D   Pt visible on the milieu interacting with peers   She reports some withdrawal symptoms and received medications   She reports feeling some better A    Verbal support given   Medications administered and effectiveness monitored   Q 15 min checks  R   Pt is safe at present time

## 2017-11-16 NOTE — [facility] Suicide Risk Assessment (Signed)
[facility]   Principal Problem: Severe recurrent major depression without psychotic features [facility](HCC) Discharge Diagnoses:  Patient Active Problem List   Diagnosis Date Noted  . Severe recurrent major depression without psychotic features (HCC) [F33.2] 11/14/2017  . 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 Cocaine abuse (HCC) [F14.10] 11/14/2017  . 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 BACTERIAL VAGINITIS [N76.0] 01/04/2010  . VAGINAL DISCHARGE [N89.8] 01/04/2010    Total Time spent with patient: 30 minutes  Musculoskeletal: Strength & Muscle Tone: within normal limits Gait & Station: normal Patient leans: N/A  Psychiatric Specialty Exam: Review of Systems  All other systems reviewed and are negative.   Blood pressure 101/71, pulse 80, temperature 98.4 F (36.9 C), temperature source Oral, resp. rate 16, height 5\' 7"  (1.702 m), weight 62.6 kg (138 lb), last menstrual period 11/12/2017, SpO2 100 %.Body mass index is 21.61 kg/m.  General Appearance: Casual  Eye Contact::  Fair  Speech:  Normal Rate409  Volume:  Normal  Mood:  Anxious  Affect:  Appropriate  Thought Process:  Coherent  Orientation:  Full (Time, Place, and Person)  Thought Content:  Logical  Suicidal Thoughts:  No  Homicidal Thoughts:  No  Memory:  Immediate;   Fair  Judgement:  Intact  Insight:  Lacking  Psychomotor Activity:  Normal  Concentration:  Fair  Recall:  [employer]Fair  Fund of Knowledge:Fair  Language: Fair  Akathisia:  No  Handed:  Right  AIMS (if indicated):     Assets:  Desire for Improvement Social Support  Sleep:  Number of Hours: 6.5  Cognition: WNL  ADL's:  Intact   Mental Status Per Nursing Assessment::   On Admission:     Demographic Factors:  Caucasian, Low socioeconomic status, Living alone and Unemployed  Loss Factors: Financial problems/change in socioeconomic status  Historical Factors: Impulsivity  Risk Reduction  Factors:   NA  Continued Clinical Symptoms:  Alcohol/Substance Abuse/Dependencies  Cognitive Features That Contribute To Risk:  None    Suicide Risk:  Minimal: No identifiable suicidal ideation.  Patients presenting with no risk factors but with morbid ruminations; may be classified as minimal risk based on the severity of the depressive symptoms  Follow-up Information    Dalbert MayotteSnider, Alison, MD Follow up.   Specialty:  Family Medicine Why:  CSW will make an appointment. Contact information: [address] [place] [address] [phone]           Plan Of Care/Follow-up recommendations:  Activity:  ad lib  Antonieta PertGreg Lawson Darya Bigler, MD 11/16/2017, 9:58 AM

## 2018-03-14 ENCOUNTER — Emergency Department (HOSPITAL_COMMUNITY)
Admission: EM | Admit: 2018-03-14 | Discharge: 2018-03-14 | Disposition: A | Discharge status: Home / Self Care | Payer: Self-pay | Attending: Emergency Medicine | Admitting: Emergency Medicine

## 2018-03-14 ENCOUNTER — Other Ambulatory Visit: Payer: Self-pay

## 2018-03-14 ENCOUNTER — Emergency Department (HOSPITAL_COMMUNITY): Payer: Self-pay

## 2018-03-14 ENCOUNTER — Encounter (HOSPITAL_COMMUNITY): Payer: Self-pay

## 2018-03-14 DIAGNOSIS — R0789 Other chest pain: Secondary | ICD-10-CM | POA: Insufficient documentation

## 2018-03-14 DIAGNOSIS — M549 Dorsalgia, unspecified: Secondary | ICD-10-CM | POA: Insufficient documentation

## 2018-03-14 DIAGNOSIS — Z79899 Other long term (current) drug therapy: Secondary | ICD-10-CM | POA: Insufficient documentation

## 2018-03-14 DIAGNOSIS — F17210 Nicotine dependence, cigarettes, uncomplicated: Secondary | ICD-10-CM | POA: Insufficient documentation

## 2018-03-14 DIAGNOSIS — R0602 Shortness of breath: Secondary | ICD-10-CM | POA: Insufficient documentation

## 2018-03-14 LAB — CBC
HEMATOCRIT: 40 % (ref 36.0–46.0)
Hemoglobin: 13.6 g/dL (ref 12.0–15.0)
MCH: 31.3 pg (ref 26.0–34.0)
MCHC: 34 g/dL (ref 30.0–36.0)
MCV: 92 fL (ref 78.0–100.0)
Platelets: 276 10*3/uL (ref 150–400)
RBC: 4.35 MIL/uL (ref 3.87–5.11)
RDW: 14.3 % (ref 11.5–15.5)
WBC: 6.5 10*3/uL (ref 4.0–10.5)

## 2018-03-14 LAB — BASIC METABOLIC PANEL
Anion gap: 8 (ref 5–15)
BUN: 13 mg/dL (ref 6–20)
CHLORIDE: 107 mmol/L (ref 98–111)
CO2: 29 mmol/L (ref 22–32)
Calcium: 9.4 mg/dL (ref 8.9–10.3)
Creatinine, Ser: 0.72 mg/dL (ref 0.44–1.00)
GFR calc Af Amer: >60 mL/min (ref >60)
GFR calc non Af Amer: >60 mL/min (ref >60)
GLUCOSE: 108 mg/dL — AB (ref 70–99)
POTASSIUM: 4.4 mmol/L (ref 3.5–5.1)
Sodium: 144 mmol/L (ref 135–145)

## 2018-03-14 LAB — I-STAT TROPONIN, ED: Troponin i, poc: 0 ng/mL (ref 0.00–0.08)

## 2018-03-14 LAB — I-STAT BETA HCG BLOOD, ED (MC, WL, AP ONLY)

## 2018-03-14 MED ORDER — IOPAMIDOL (ISOVUE-370) INJECTION 76%
INTRAVENOUS | Status: AC
  Filled 2018-03-14: qty 100

## 2018-03-14 MED ORDER — OMEPRAZOLE 20 MG PO CPDR: 20.0000 mg | DELAYED_RELEASE_CAPSULE | Freq: Every day | ORAL | 0 refills | Status: AC

## 2018-03-14 MED ORDER — IOPAMIDOL (ISOVUE-370) INJECTION 76%
100.0000 mL | Freq: Once | INTRAVENOUS | Status: AC | PRN
  Administered 2018-03-14: 100 mL via INTRAVENOUS

## 2018-03-14 MED ORDER — ACETAMINOPHEN 325 MG PO TABS
650.0000 mg | ORAL_TABLET | Freq: Once | ORAL | Status: AC
  Administered 2018-03-14: 650 mg via ORAL
  Filled 2018-03-14: qty 2

## 2018-03-14 MED ORDER — METHOCARBAMOL 500 MG PO TABS
500.0000 mg | ORAL_TABLET | Freq: Two times a day (BID) | ORAL | 0 refills | Status: AC
Start: 2018-03-14 — End: ?

## 2018-03-14 NOTE — ED Triage Notes (Signed)
Pt reports that when she woke up this morning, she was experiencing centralized stabbing, burning chest pain that radiates to her back. She states that the pain is more in her back (mid, upper) than her chest now and is worse with movement. Denies N/V. Endorses some SOB, but attributes that to smoking.

## 2018-03-14 NOTE — ED Notes (Signed)
ED Provider at bedside. 

## 2018-03-14 NOTE — ED Provider Notes (Signed)
[facility]-EMERGENCY DEPT Provider Note   CSN: [id number] Arrival date & time: 03/14/18  1341     History   Chief Complaint Chief Complaint  Patient presents with  . Back Pain  . Chest Pain    HPI Marissa Beasley is a 37 y.o. female with a history of cocaine/herion abuse who has been clean for 53 days who presents to the ED today for chest pain and back pain. Patient reports that she awoke this morning at 10am with an uncormtorable, stabbing/burning sensation in her central chest that radiates up to her mid/upper back between her scapulas.  She reports it has been constant since onset.  She states that her chest pain is improved but her back pain is worsened.  She notes that she has baseline shortness of breath secondary to smoking but feels this may be slightly worsened from baseline.  She notes that it is worse with movement and palpation.  She denies any exertional or pleuritic chest pain.  Patient denies any nausea, emesis, diaphoresis.  Patient notes that she does have a history of acid reflux but states it feels different.  She tried over-the-counter Tums, and baking soda for symptoms without any relief.  She denies any burping or belching.  No regurgitation.  Patient reports she has been clean from illicit drugs for the last 53 days.  Patient denies any history of similar pain in the past.  She denies any fever, abdominal pain, neck pain, dizziness, syncope or visual changes. Patient reports long history of tobacco abuse.  Prior MI, CVA, TIA.  She has history of hypertension, hyperlipidemia, diabetes.  No family history of early CAD. Denies exogenous estrogen use, recent surgery or travel, trauma, immobilization, previous blood clot, cough, hemoptysis, personal history of cancer, lower extremity pain or swelling.  HPI  Past Medical History:  Diagnosis Date  . ADD (attention deficit disorder)   . Bipolar disorder (HCC)   . Depression   . Gestational diabetes     . 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 Severe recurrent major depression without psychotic features (HCC) 11/14/2017  . Opiate dependence, continuous (HCC) 11/14/2017  . Cocaine abuse (HCC) 11/14/2017  . 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 BACTERIAL VAGINITIS 01/04/2010  . VAGINAL DISCHARGE 01/04/2010    Past Surgical History:  Procedure Laterality Date  . CHOLECYSTECTOMY  2011  . TUBAL LIGATION  2010     OB History   None      Home Medications    Prior to Admission medications   Medication Sig Start Date End Date Taking? Authorizing Provider  hydrOXYzine (ATARAX/VISTARIL) 25 MG tablet Take 1 tablet (25 mg total) by mouth every 6 (six) hours as needed for anxiety. 11/16/17   Money, Gerlene Burdock, FNP  traZODone (DESYREL) 100 MG tablet Take 1 tablet (100 mg total) by mouth at bedtime. For sleep 11/16/17   Money, Gerlene Burdock, FNP    Family History Family History  Problem Relation Age of Onset  . Diabetes Other     Social History Social History   Tobacco Use  . Smoking status: Current Every Day Smoker    Packs/day: 2.00    Types: Cigarettes  . Smokeless tobacco: Never Used  Substance Use Topics  . Alcohol use: No  . Drug use: Yes    Frequency: 7.0 times per week    Types: Heroin    Comment: heroin     Allergies  Tylenol [acetaminophen]   Review of Systems Review of Systems  All other systems reviewed and are negative.    Physical Exam Updated Vital Signs BP 103/70 (BP Location: Right Arm)   Pulse 60   Temp 97.6 F (36.4 C) (Oral)   Resp 17   SpO2 100%   Physical Exam  Constitutional: She appears well-developed and well-nourished.  HENT:  Head: Normocephalic and atraumatic.  Right Ear: External ear normal.  Left Ear: External ear normal.  Nose: Nose normal.  Mouth/Throat: Uvula is midline, oropharynx is clear and moist and mucous membranes are normal. No tonsillar exudate.  Eyes: Pupils are equal,  round, and reactive to light. Right eye exhibits no discharge. Left eye exhibits no discharge. No scleral icterus.  Neck: Trachea normal. Neck supple. No JVD present. No spinous process tenderness present. Carotid bruit is not present. No neck rigidity. Normal range of motion present.  No carotid bruit  Cardiovascular: Normal rate, regular rhythm and intact distal pulses.  No murmur heard. Pulses:      Radial pulses are 2+ on the right side, and 2+ on the left side.       Dorsalis pedis pulses are 2+ on the right side, and 2+ on the left side.       Posterior tibial pulses are 2+ on the right side, and 2+ on the left side.  No lower extremity swelling or edema. Calves symmetric in size bilaterally.  Pulmonary/Chest: Effort normal and breath sounds normal. She exhibits no tenderness.  Abdominal: Soft. Bowel sounds are normal. There is no tenderness. There is no rigidity, no rebound, no guarding, no CVA tenderness and negative Murphy's sign.  Musculoskeletal: She exhibits no edema.  Lymphadenopathy:    She has no cervical adenopathy.  Neurological: She is alert.  Speech clear. Follows commands. No facial droop. PERRLA. EOM grossly intact. CN III-XII grossly intact. Grossly moves all extremities 4 without ataxia. Able and appropriate strength for age to upper and lower extremities bilaterally including  Skin: Skin is warm and dry. No rash noted. She is not diaphoretic.  Psychiatric: She has a normal mood and affect.  Nursing note and vitals reviewed.    ED Treatments / Results  Labs (all labs ordered are listed, but only abnormal results are displayed) Labs Reviewed  BASIC METABOLIC PANEL - Abnormal; Notable for the following components:      Result Value   Glucose, Bld 108 (*)    All other components within normal limits  CBC  I-STAT TROPONIN, ED  I-STAT BETA HCG BLOOD, ED ([facility], [facility], [facility] ONLY)    EKG None  Radiology Dg Chest 2 View  Result Date: 03/14/2018 CLINICAL DATA:  Pt  reports that when she woke up this morning, she was experiencing centralized stabbing, burning chest pain that radiates to her back Hx 1 pk a day smoker EXAM: CHEST - 2 VIEW COMPARISON:  None. FINDINGS: The heart size and mediastinal contours are within normal limits. Both lungs are clear. No pleural effusion or pneumothorax. The visualized skeletal structures are unremarkable. IMPRESSION: Normal chest radiographs. Electronically Signed   By: Amie Portland M.D.   On: 03/14/2018 14:40   Ct Angio Chest/abd/pel For Dissection W And/or W/wo  Result Date: 03/14/2018 CLINICAL DATA:  Central stabbing chest pain radiating to the back starting this morning. EXAM: CT ANGIOGRAPHY CHEST, ABDOMEN AND PELVIS TECHNIQUE: Multidetector CT imaging through the chest, abdomen and pelvis was performed using the standard protocol during bolus administration of intravenous contrast. Multiplanar reconstructed  images and MIPs were obtained and reviewed to evaluate the vascular anatomy. CONTRAST:  100mL ISOVUE-370 IOPAMIDOL (ISOVUE-370) INJECTION 76% COMPARISON:  None. FINDINGS: CTA CHEST FINDINGS Cardiovascular: Satisfactory opacification of the pulmonary arteries to the segmental level. No evidence of pulmonary embolism. Normal heart size. No pericardial effusion. Mediastinum/Nodes: No enlarged mediastinal, hilar, or axillary lymph nodes. Thyroid gland, trachea, and esophagus demonstrate no significant findings. Lungs/Pleura: Mild atelectasis of bilateral posterior lung bases are noted. The lungs are otherwise clear. There is no pleural effusion or pneumothorax. No focal pneumonia is identified. Musculoskeletal: No chest wall abnormality. No acute or significant osseous findings. Review of the MIP images confirms the above findings. CTA ABDOMEN AND PELVIS FINDINGS VASCULAR Aorta: Normal caliber aorta without aneurysm, dissection, vasculitis or significant stenosis. Celiac: Patent without evidence of aneurysm, dissection, vasculitis or  significant stenosis. SMA: Patent without evidence of aneurysm, dissection, vasculitis or significant stenosis. Renals: Both renal arteries are patent without evidence of aneurysm, dissection, vasculitis, fibromuscular dysplasia or significant stenosis. IMA: Patent without evidence of aneurysm, dissection, vasculitis or significant stenosis. Inflow: Patent without evidence of aneurysm, dissection, vasculitis or significant stenosis. Veins: No obvious venous abnormality within the limitations of this arterial phase study. Review of the MIP images confirms the above findings. NON-VASCULAR Hepatobiliary: No focal liver abnormality is seen. No gallstones, gallbladder wall thickening, or biliary dilatation. Pancreas: Unremarkable. No pancreatic ductal dilatation or surrounding inflammatory changes. Spleen: Normal in size without focal abnormality. Adrenals/Urinary Tract: Adrenal glands are unremarkable. Kidneys are normal, without renal calculi, focal lesion, or hydronephrosis. Bladder is unremarkable. Stomach/Bowel: Stomach is within normal limits. The appendix is not seen but no inflammation is noted around cecum. No evidence of bowel wall thickening, distention, or inflammatory changes. Lymphatic: No significant vascular findings are present. No enlarged abdominal or pelvic lymph nodes. Reproductive: Uterus and bilateral adnexa are unremarkable. Other: None. Musculoskeletal: Minimal degenerative joint changes are identified. Review of the MIP images confirms the above findings. IMPRESSION: No evidence of aortic dissection or aneurysm. No pulmonary embolus. Mild dependent atelectasis of the posterior lung bases. Otherwise no acute abnormality identified within the chest. No acute abnormality identified in the abdomen and pelvis. Electronically Signed   By: Sherian ReinWei-Chen  Lin M.D.   On: 03/14/2018 16:50    Procedures Procedures (including critical care time)  Medications Ordered in ED Medications  acetaminophen  (TYLENOL) tablet 650 mg (650 mg Oral Given 03/14/18 1438)  iopamidol (ISOVUE-370) 76 % injection 100 mL ( Intravenous Canceled Entry 03/14/18 1624)     Initial Impression / Assessment and Plan / ED Course  I have reviewed the triage vital signs and the nursing notes.  Pertinent labs & imaging results that were available during my care of the patient were reviewed by me and considered in my medical decision making (see chart for details).     Patient presented with chest pain to the ED. Patient is to be discharged with recommendation to follow up with PCP in regards to today's hospital visit. Chest pain is not likely of cardiac or pulmonary etiology due to presentation, perc negative, stable vital signs, no tracheal deviation, no JVD or new murmur, RRR, breath sounds equal bilaterally, EKG without acute abnormalities, negative troponin x 2, negative CXR, & negative CTA of chest and abdomen that did not show signs of PE or dissection. Symptoms atypical for esophageal perforation. HEART score is 1. Patient has been advised to return to the ED if chest pain becomes exertional, associated with diaphoresis or nausea, radiates to left jaw/arm,  worsens or becomes concerning in any way. Patient appears reliable for follow up and is agreeable to discharge. I advised the patient to follow-up with their primary care provider this week. I advised the patient to return to the emergency department with new or worsening symptoms or new concerns. The patient verbalized understanding and agreement with plan. Patient stable for discharge.  Final Clinical Impressions(s) / ED Diagnoses   Final diagnoses:  Atypical chest pain    ED Discharge Orders         Ordered    methocarbamol (ROBAXIN) 500 MG tablet  2 times daily     03/14/18 1739    omeprazole (PRILOSEC) 20 MG capsule  Daily     03/14/18 1739           Princella Pellegrini 03/14/18 1744    Tilden Fossa, MD 03/15/18 [phone]

## 2018-03-14 NOTE — ED Notes (Signed)
RN has requested IV Ultrasound.

## 2018-03-14 NOTE — ED Notes (Signed)
Bed: WA08 Expected date:  Expected time:  Means of arrival:  Comments: 

## 2018-03-14 NOTE — ED Notes (Signed)
Pt states that she does not want any narcotics for any reason.

## 2018-03-14 NOTE — Discharge Instructions (Addendum)
Read instructions below for reasons to return to the Emergency Department. It is recommended that your follow up with your Primary Care Doctor in regards to today's visit. If you do not have a doctor, use the resource guide listed below to help you find one. Begin taking omeprazole and robaxin as prescribed. Do not take robaxin and drive.   Tests performed today include: An EKG of your heart A chest x-ray CT scan of your chest and abdomen  Cardiac enzymes - a blood test for heart muscle damage Blood counts and electrolytes Vital signs. See below for your results today.   Chest Pain (Nonspecific)  HOME CARE INSTRUCTIONS  For the next few days, avoid physical activities that bring on chest pain. Continue physical activities as directed.  Do not smoke cigarettes or drink alcohol until your symptoms are gone. If you do smoke, it is time to quit. You may receive instructions and counseling on how to stop smoking. Only take over-the-counter or prescription medicine for pain, discomfort, or fever as directed by your caregiver.  Follow your caregiver's suggestions for further testing if your chest pain does not go away.  Keep any follow-up appointments you made. If you do not go to an appointment, you could develop lasting (chronic) problems with pain. If there is any problem keeping an appointment, you must call to reschedule.  SEEK MEDICAL CARE IF:  You think you are having problems from the medicine you are taking. Read your medicine instructions carefully.  Your chest pain does not go away, even after treatment.  You develop a rash with blisters on your chest.  SEEK IMMEDIATE MEDICAL CARE IF:  You have increased chest pain or pain that spreads to your arm, neck, jaw, back, or belly (abdomen).  You develop shortness of breath, an increasing cough, or you are coughing up blood.  You have severe back or abdominal pain, feel sick to your stomach (nauseous) or throw up (vomit).  You develop severe  weakness, fainting, or chills.  You have an oral temperature above 102 F (38.9 C), not controlled by medicine.  THIS IS AN EMERGENCY. Do not wait to see if the pain will go away. Get medical help at once. Call your local emergency services (911 in [place] Do not drive yourself to the hospital. Additional Information:  Your vital signs today were: BP 113/71    Pulse (!) 50    Temp 97.6 F (36.4 C) (Oral)    Resp 12    LMP 03/07/2018    SpO2 100%  If your blood pressure (BP) was elevated above 135/85 this visit, please have this repeated by your doctor within one month. ---------------

## 2019-01-09 ENCOUNTER — Encounter (HOSPITAL_COMMUNITY): Payer: Self-pay

## 2019-01-09 ENCOUNTER — Emergency Department (HOSPITAL_COMMUNITY)
Admission: EM | Admit: 2019-01-09 | Discharge: 2019-01-09 | Disposition: A | Discharge status: Home / Self Care | Payer: No Typology Code available for payment source | Attending: Emergency Medicine | Admitting: Emergency Medicine

## 2019-01-09 ENCOUNTER — Other Ambulatory Visit: Payer: Self-pay

## 2019-01-09 ENCOUNTER — Emergency Department (HOSPITAL_COMMUNITY): Payer: No Typology Code available for payment source

## 2019-01-09 DIAGNOSIS — F17210 Nicotine dependence, cigarettes, uncomplicated: Secondary | ICD-10-CM | POA: Diagnosis not present

## 2019-01-09 DIAGNOSIS — S2231XA Fracture of one rib, right side, initial encounter for closed fracture: Secondary | ICD-10-CM

## 2019-01-09 DIAGNOSIS — S29001A Unspecified injury of muscle and tendon of front wall of thorax, initial encounter: Secondary | ICD-10-CM | POA: Diagnosis present

## 2019-01-09 DIAGNOSIS — Z79899 Other long term (current) drug therapy: Secondary | ICD-10-CM | POA: Diagnosis not present

## 2019-01-09 DIAGNOSIS — Y999 Unspecified external cause status: Secondary | ICD-10-CM | POA: Insufficient documentation

## 2019-01-09 DIAGNOSIS — Y939 Activity, unspecified: Secondary | ICD-10-CM | POA: Diagnosis not present

## 2019-01-09 DIAGNOSIS — Y92410 Unspecified street and highway as the place of occurrence of the external cause: Secondary | ICD-10-CM | POA: Diagnosis not present

## 2019-01-09 MED ORDER — HYDROCODONE-ACETAMINOPHEN 5-325 MG PO TABS: 1.0000 | ORAL_TABLET | Freq: Four times a day (QID) | ORAL | 0 refills | Status: AC | PRN

## 2019-01-09 MED ORDER — IBUPROFEN 600 MG PO TABS: 600.0000 mg | ORAL_TABLET | Freq: Four times a day (QID) | ORAL | 0 refills | Status: AC | PRN

## 2019-01-09 MED ORDER — IBUPROFEN 200 MG PO TABS
600.0000 mg | ORAL_TABLET | Freq: Once | ORAL | Status: AC
  Administered 2019-01-09: 600 mg via ORAL
  Filled 2019-01-09: qty 3

## 2019-01-09 MED ORDER — HYDROCODONE-ACETAMINOPHEN 5-325 MG PO TABS: 1.0000 | ORAL_TABLET | Freq: Four times a day (QID) | ORAL | 0 refills | Status: DC | PRN

## 2019-01-09 MED ORDER — CYCLOBENZAPRINE HCL 10 MG PO TABS
10.0000 mg | ORAL_TABLET | Freq: Two times a day (BID) | ORAL | 0 refills | Status: AC | PRN
Start: 2019-01-09 — End: ?

## 2019-01-09 NOTE — ED Notes (Signed)
Patient instructed on use of incentive spirometer. Teach back method used. 

## 2019-01-09 NOTE — ED Notes (Signed)
ED Provider at bedside. 
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
## 2019-01-09 NOTE — Discharge Instructions (Signed)
The pain you are experiencing is likely due to muscle strain, x-ray did show a possible small rib fracture on the right.  You may take Ibuprofen and Flexeril as needed for pain management.  Go as needed for breakthrough pain.  Do not combine with any pain reliever other than tylenol.  You may also use ice and heat, and over-the-counter remedies such as Biofreeze gel or salon pas lidocaine patches. The muscle soreness should improve over the next week. Follow up with your family doctor in the next week for a recheck if you are still having symptoms. Return to ED if pain is worsening, you develop weakness or numbness of extremities, or new or concerning symptoms develop.

## 2019-01-09 NOTE — ED Notes (Signed)
Patient provided with ice packs.
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
## 2019-05-31 ENCOUNTER — Encounter (HOSPITAL_COMMUNITY): Payer: Self-pay | Admitting: Emergency Medicine

## 2019-05-31 ENCOUNTER — Other Ambulatory Visit: Payer: Self-pay

## 2019-05-31 ENCOUNTER — Emergency Department (HOSPITAL_COMMUNITY)
Admission: EM | Admit: 2019-05-31 | Discharge: 2019-05-31 | Discharge status: Against Medical Advice | Payer: Medicaid Other | Attending: Emergency Medicine | Admitting: Emergency Medicine

## 2019-05-31 DIAGNOSIS — Z5320 Procedure and treatment not carried out because of patient's decision for unspecified reasons: Secondary | ICD-10-CM | POA: Insufficient documentation

## 2019-05-31 DIAGNOSIS — F17210 Nicotine dependence, cigarettes, uncomplicated: Secondary | ICD-10-CM | POA: Insufficient documentation

## 2019-05-31 DIAGNOSIS — R109 Unspecified abdominal pain: Secondary | ICD-10-CM | POA: Insufficient documentation

## 2019-05-31 DIAGNOSIS — F1110 Opioid abuse, uncomplicated: Secondary | ICD-10-CM | POA: Insufficient documentation

## 2019-05-31 DIAGNOSIS — Z79899 Other long term (current) drug therapy: Secondary | ICD-10-CM | POA: Insufficient documentation

## 2019-05-31 LAB — URINALYSIS, ROUTINE W REFLEX MICROSCOPIC
Bilirubin Urine: NEGATIVE
Glucose, UA: NEGATIVE mg/dL
Ketones, ur: NEGATIVE mg/dL
Leukocytes,Ua: NEGATIVE
Nitrite: NEGATIVE
Protein, ur: NEGATIVE mg/dL
Specific Gravity, Urine: 1.024 (ref 1.005–1.030)
pH: 5 (ref 5.0–8.0)

## 2019-05-31 LAB — POC URINE PREG, ED: Preg Test, Ur: NEGATIVE

## 2019-05-31 NOTE — ED Notes (Addendum)
Patient left before getting AVS and vital signs. Pt was ambulatory upon discharge. NAD.

## 2019-05-31 NOTE — ED Triage Notes (Signed)
Pt reports left flank pains that started earlier today. Denies any vomiting or urinary problems.
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
## 2020-07-25 ENCOUNTER — Other Ambulatory Visit: Payer: Medicaid Other
